# Patient Record
Sex: Male | Born: 1950
Health system: Southern US, Community
[De-identification: ages and names within clinical notes are randomized; demographics above are authoritative.]

## PROBLEM LIST (undated history)

## (undated) DIAGNOSIS — I10 Essential (primary) hypertension: Secondary | ICD-10-CM

## (undated) DIAGNOSIS — E785 Hyperlipidemia, unspecified: Secondary | ICD-10-CM

## (undated) HISTORY — DX: Hyperlipidemia, unspecified: E78.5

## (undated) HISTORY — PX: COLONOSCOPY W/ POLYPECTOMY: SHX1380

## (undated) HISTORY — PX: COLON SURGERY: SHX602

---

## 2001-05-17 ENCOUNTER — Emergency Department (HOSPITAL_COMMUNITY): Admission: EM | Admit: 2001-05-17 | Discharge: 2001-05-17 | Payer: Self-pay | Admitting: *Deleted

## 2002-01-05 ENCOUNTER — Other Ambulatory Visit: Admission: RE | Admit: 2002-01-05 | Discharge: 2002-01-05 | Payer: Self-pay | Admitting: Dermatology

## 2005-04-02 HISTORY — PX: KNEE SURGERY: SHX244

## 2006-11-28 ENCOUNTER — Ambulatory Visit (HOSPITAL_COMMUNITY): Admission: RE | Admit: 2006-11-28 | Discharge: 2006-11-28 | Payer: Self-pay | Admitting: Family Medicine

## 2006-12-17 ENCOUNTER — Ambulatory Visit (HOSPITAL_COMMUNITY): Admission: RE | Admit: 2006-12-17 | Discharge: 2006-12-17 | Payer: Self-pay | Admitting: General Surgery

## 2006-12-17 ENCOUNTER — Encounter (INDEPENDENT_AMBULATORY_CARE_PROVIDER_SITE_OTHER): Payer: Self-pay | Admitting: General Surgery

## 2008-07-07 ENCOUNTER — Emergency Department (HOSPITAL_COMMUNITY): Admission: EM | Admit: 2008-07-07 | Discharge: 2008-07-07 | Payer: Self-pay | Admitting: Emergency Medicine

## 2010-04-18 ENCOUNTER — Ambulatory Visit (HOSPITAL_COMMUNITY)
Admission: RE | Admit: 2010-04-18 | Discharge: 2010-04-18 | Payer: Self-pay | Source: Home / Self Care | Attending: General Surgery | Admitting: General Surgery

## 2010-04-19 ENCOUNTER — Ambulatory Visit (HOSPITAL_COMMUNITY)
Admission: RE | Admit: 2010-04-19 | Discharge: 2010-04-19 | Payer: Self-pay | Source: Home / Self Care | Attending: General Surgery | Admitting: General Surgery

## 2010-04-19 LAB — COMPREHENSIVE METABOLIC PANEL
ALT: 46 U/L (ref 0–53)
AST: 49 U/L — ABNORMAL HIGH (ref 0–37)
Albumin: 4.5 g/dL (ref 3.5–5.2)
Alkaline Phosphatase: 59 U/L (ref 39–117)
BUN: 11 mg/dL (ref 6–23)
CO2: 27 mEq/L (ref 19–32)
Calcium: 10 mg/dL (ref 8.4–10.5)
Chloride: 102 mEq/L (ref 96–112)
Creatinine, Ser: 0.94 mg/dL (ref 0.4–1.5)
GFR calc Af Amer: >60 mL/min (ref >60)
GFR calc non Af Amer: >60 mL/min (ref >60)
Glucose, Bld: 99 mg/dL (ref 70–99)
Potassium: 3.8 mEq/L (ref 3.5–5.1)
Sodium: 138 mEq/L (ref 135–145)
Total Bilirubin: 1.1 mg/dL (ref 0.3–1.2)
Total Protein: 7.1 g/dL (ref 6.0–8.3)

## 2010-04-19 LAB — CBC
HCT: 42.9 % (ref 39.0–52.0)
Hemoglobin: 15.3 g/dL (ref 13.0–17.0)
MCH: 34.7 pg — ABNORMAL HIGH (ref 26.0–34.0)
MCHC: 35.7 g/dL (ref 30.0–36.0)
MCV: 97.3 fL (ref 78.0–100.0)
Platelets: 231 10*3/uL (ref 150–400)
RBC: 4.41 MIL/uL (ref 4.22–5.81)
RDW: 13 % (ref 11.5–15.5)
WBC: 10.8 10*3/uL — ABNORMAL HIGH (ref 4.0–10.5)

## 2010-04-19 LAB — GLUCOSE, CAPILLARY: Glucose-Capillary: 98 mg/dL (ref 70–99)

## 2010-04-20 NOTE — Op Note (Signed)
NAME:  Erik Hernandez, Erik Hernandez           ACCOUNT NO.:  [id number]  MEDICAL RECORD NO.:  [id number]          PATIENT TYPE:  AMB  LOCATION:  DAY                           FACILITY:  [facility]  PHYSICIAN:  Dalia Heading, M.D.  DATE OF BIRTH:  March 27, 1951  DATE OF PROCEDURE:  04/18/2010 DATE OF DISCHARGE:                              OPERATIVE REPORT   PREOPERATIVE DIAGNOSIS:  Hematochezia.  POSTOPERATIVE DIAGNOSES: 1. Hematochezia. 2. Cecal neoplasm. 3. Proximal transverse colon polyp. 4. Polyps x2 in the sigmoid colon. 5. Diverticulosis of transverse, descending, and sigmoid colon     regions.  PROCEDURE:  Colonoscopy with snare polypectomies.  SURGEON:  Dalia Heading, MD  ANESTHESIA:  Demerol 50 mg IV, Versed 4 mg IV.  INDICATIONS:  The patient is a 60 year old white male who had several episodes of blood per rectum last week.  They have since stopped.  The patient now comes into the endoscopy suite for a colonoscopy.  He last had a colonoscopy in 2008, at which time multiple polyps were removed and diverticulosis was seen.  There was no family history of colon carcinoma.  The risks and benefits of the procedure including bleeding and perforation were fully explained to the patient, gave informed consent.  PROCEDURE NOTE:  The patient was placed in the left lateral decubitus position after placement of monitored anesthesia care.  Demerol and Versed were used throughout the procedure for anesthesia.  A rectal examination was performed which was negative.  The colonoscope was advanced to the cecum without difficulty.  Confirmation of placement to the cecum was done using transabdominal palpation and landmarks.  A sessile polypoid lesion was noted on the ileocecal valve.  In addition, there was a protruding neoplasm at the appendiceal orifice.  Multiple biopsies of both these regions were taken, sent to Pathology.  I did not feel comfortable removing the appendiceal mass as it  appeared almost to be full thickness with inversion.  The lumen was found.  The ascending colon was within normal limits.  In the proximal transverse colon, the sessile polyp was seen.  This was removed using the snare and cautery and sent to Pathology for further examination.  The distal transverse colon and descending colon regions were notable for diverticular disease.  In the sigmoid colon, extensive diverticular disease was seen. At approximately 25 cm from the anus, 1 pedunculated polyp was seen as well as a sessile polyp adjacent to it.  Both were removed using the snare and retrieved.  They were sent to Pathology for further examination.  The rectal was within normal limits.  No abnormal lesions were noted.  Retroflexion of the colonoscope revealed a small internal hemorrhoid present.  The colonoscope was returned to the neutral position and all air was evacuated from the colon and rectum prior to removal of the colonoscope.  The patient tolerated the procedure well.  There was no evidence of perforation at the end of the procedure.  The patient was transferred back to Day Surgery in stable condition.  COMPLICATIONS:  None.  SPECIMEN: 1. Appendiceal and ileocecal valve neoplasm, biopsies pending. 2. Transverse colon polyp, pathology pending. 3. Polyps x2,  sigmoid colon, pathology pending.  PLAN:  Further management pending the pathology results.  He will need to undergo a right hemicolectomy given the mass that we could not be removed from the appendiceal orifice.     Dalia Heading, M.D.     MAJ/MEDQ  D:  04/18/2010  T:  04/18/2010  Job:  [id number]  cc:   Corrie Mckusick, M.D. Fax: [phone]  Electronically Signed by Franky Macho M.D. on 04/20/2010 12:14:14 PM

## 2010-04-22 ENCOUNTER — Encounter: Payer: Self-pay | Admitting: Unknown Physician Specialty

## 2010-06-16 ENCOUNTER — Other Ambulatory Visit: Payer: Self-pay | Admitting: General Surgery

## 2010-06-16 ENCOUNTER — Encounter (HOSPITAL_COMMUNITY): Payer: BC Managed Care – PPO

## 2010-06-16 LAB — BASIC METABOLIC PANEL
BUN: 19 mg/dL (ref 6–23)
CO2: 26 mEq/L (ref 19–32)
Calcium: 9.6 mg/dL (ref 8.4–10.5)
Chloride: 101 mEq/L (ref 96–112)
Creatinine, Ser: 1.05 mg/dL (ref 0.4–1.5)
GFR calc Af Amer: >60 mL/min (ref >60)
GFR calc non Af Amer: >60 mL/min (ref >60)
Glucose, Bld: 103 mg/dL — ABNORMAL HIGH (ref 70–99)
Potassium: 3.7 mEq/L (ref 3.5–5.1)
Sodium: 139 mEq/L (ref 135–145)

## 2010-06-16 LAB — CBC
HCT: 43.7 % (ref 39.0–52.0)
Hemoglobin: 15.2 g/dL (ref 13.0–17.0)
MCH: 32.5 pg (ref 26.0–34.0)
MCHC: 34.8 g/dL (ref 30.0–36.0)
MCV: 93.6 fL (ref 78.0–100.0)
Platelets: 211 10*3/uL (ref 150–400)
RBC: 4.67 MIL/uL (ref 4.22–5.81)
RDW: 13 % (ref 11.5–15.5)
WBC: 9.1 10*3/uL (ref 4.0–10.5)

## 2010-06-16 LAB — ABO/RH: ABO/RH(D): B NEG

## 2010-06-16 LAB — SURGICAL PCR SCREEN
MRSA, PCR: NEGATIVE
Staphylococcus aureus: NEGATIVE

## 2010-06-21 ENCOUNTER — Inpatient Hospital Stay (HOSPITAL_COMMUNITY)
Admission: RE | Admit: 2010-06-21 | Discharge: 2010-06-24 | DRG: 149 | Disposition: A | Discharge status: Home / Self Care | Payer: BC Managed Care – PPO | Source: Ambulatory Visit | Attending: General Surgery | Admitting: General Surgery

## 2010-06-21 ENCOUNTER — Other Ambulatory Visit: Payer: Self-pay | Admitting: General Surgery

## 2010-06-21 DIAGNOSIS — D126 Benign neoplasm of colon, unspecified: Principal | ICD-10-CM | POA: Diagnosis present

## 2010-06-21 DIAGNOSIS — Z01812 Encounter for preprocedural laboratory examination: Secondary | ICD-10-CM

## 2010-06-21 DIAGNOSIS — I10 Essential (primary) hypertension: Secondary | ICD-10-CM | POA: Diagnosis present

## 2010-06-21 DIAGNOSIS — Z01810 Encounter for preprocedural cardiovascular examination: Secondary | ICD-10-CM

## 2010-06-21 DIAGNOSIS — E7800 Pure hypercholesterolemia, unspecified: Secondary | ICD-10-CM | POA: Diagnosis present

## 2010-06-21 NOTE — H&P (Signed)
  NAME:  Erik Hernandez, Erik Hernandez           ACCOUNT NO.:  [id number]  MEDICAL RECORD NO.:  [id number]           PATIENT TYPE:  LOCATION:                                 FACILITY:  PHYSICIAN:  Dalia Heading, M.D.  DATE OF BIRTH:  1950-12-03  DATE OF ADMISSION: DATE OF DISCHARGE:  LH                             HISTORY & PHYSICAL   CHIEF COMPLAINT:  Cecal neoplasm.  HISTORY OF PRESENT ILLNESS:  The patient is a 60 year old white male who underwent a colonoscopy and was found to have multiple polyps.  But none where dysplastic or malignant, though the two cecal polyps could not be safely removed.  He now presents for a partial colectomy.  PAST MEDICAL HISTORY:  High cholesterol levels, hypertension, hypothyroidism, non-insulin-dependent diabetes mellitus.  PAST SURGICAL HISTORY:  As noted above.  CURRENT MEDICATIONS:  Synthroid 25 mcg p.o. daily, simvastatin 40 mg p.o. daily, Azor 5/20 mg p.o. daily, fenofibrate 160 mg p.o. daily, bisoprolol/hydrochlorothiazide 2.5/6.25 mg p.o. daily, baby aspirin which he is holding.  ALLERGIES:  Zocor.  REVIEW OF SYSTEMS:  The patient smokes a pack cigarettes a day.  Drinks alcohol socially.  He denies any other cardiopulmonary difficulties or bleeding disorders.  FAMILY MEDICAL HISTORY:  The patient denies any family medical history of colon cancer.  PHYSICAL EXAMINATION:  GENERAL:  The patient is a well-developed, well- nourished white male in no acute distress. LUNGS:  Clear to auscultation with equal breath sounds bilaterally. HEART:  Reveals regular rate and rhythm without S3, S4, or murmurs. ABDOMEN:  Soft, nontender, and nondistended.  No hepatosplenomegaly or masses are noted. RECTAL:  Negative.  IMPRESSION:  Cecal neoplasm.  PLAN:  The patient is scheduled for a laparoscopic hand-assisted partial colectomy on June 21, 2010.  The risks and benefits of the procedure including bleeding, infection, and the possibly of a blood  transfusion were fully explained to the patient.  I gave informed consent.     Dalia Heading, M.D.     MAJ/MEDQ  D:  06/13/2010  T:  06/14/2010  Job:  [id number]  cc:   Short Stay at [facility]  Corrie Mckusick, M.D. Fax: [phone]  Electronically Signed by Franky Macho M.D. on 06/21/2010 07:33:35 AM

## 2010-06-22 LAB — BASIC METABOLIC PANEL
BUN: 9 mg/dL (ref 6–23)
CO2: 26 mEq/L (ref 19–32)
Calcium: 8.8 mg/dL (ref 8.4–10.5)
Chloride: 107 mEq/L (ref 96–112)
Creatinine, Ser: 0.81 mg/dL (ref 0.4–1.5)
GFR calc Af Amer: >60 mL/min (ref >60)
GFR calc non Af Amer: >60 mL/min (ref >60)
Glucose, Bld: 110 mg/dL — ABNORMAL HIGH (ref 70–99)
Potassium: 3.4 mEq/L — ABNORMAL LOW (ref 3.5–5.1)
Sodium: 139 mEq/L (ref 135–145)

## 2010-06-22 LAB — CBC
HCT: 39.6 % (ref 39.0–52.0)
Hemoglobin: 13.2 g/dL (ref 13.0–17.0)
MCH: 32.6 pg (ref 26.0–34.0)
MCHC: 33.3 g/dL (ref 30.0–36.0)
MCV: 97.8 fL (ref 78.0–100.0)
Platelets: 182 10*3/uL (ref 150–400)
RBC: 4.05 MIL/uL — ABNORMAL LOW (ref 4.22–5.81)
RDW: 13.1 % (ref 11.5–15.5)
WBC: 10.1 10*3/uL (ref 4.0–10.5)

## 2010-06-22 LAB — DIFFERENTIAL
Basophils Absolute: 0 10*3/uL (ref 0.0–0.1)
Basophils Relative: 0 % (ref 0–1)
Eosinophils Absolute: 0 10*3/uL (ref 0.0–0.7)
Eosinophils Relative: 0 % (ref 0–5)
Lymphocytes Relative: 16 % (ref 12–46)
Lymphs Abs: 1.6 10*3/uL (ref 0.7–4.0)
Monocytes Absolute: 0.7 10*3/uL (ref 0.1–1.0)
Monocytes Relative: 7 % (ref 3–12)
Neutro Abs: 7.8 10*3/uL — ABNORMAL HIGH (ref 1.7–7.7)
Neutrophils Relative %: 77 % (ref 43–77)

## 2010-06-22 LAB — PHOSPHORUS: Phosphorus: 3 mg/dL (ref 2.3–4.6)

## 2010-06-22 LAB — ALBUMIN: Albumin: 3.5 g/dL (ref 3.5–5.2)

## 2010-06-22 LAB — MAGNESIUM: Magnesium: 1.9 mg/dL (ref 1.5–2.5)

## 2010-06-22 NOTE — Op Note (Signed)
NAME:  Erik Hernandez, Erik Hernandez           ACCOUNT NO.:  [id number]  MEDICAL RECORD NO.:  [id number]           PATIENT TYPE:  I  LOCATION:  A308                          FACILITY:  [facility]  PHYSICIAN:  Dalia Heading, M.D.  DATE OF BIRTH:  10-Oct-1950  DATE OF PROCEDURE:  06/21/2010 DATE OF DISCHARGE:                              OPERATIVE REPORT   PREOPERATIVE DIAGNOSIS:  Neoplasm.  POSTOPERATIVE DIAGNOSIS:  Neoplasm.  PROCEDURE:  Laparoscopic hand-assisted partial colectomy.  SURGEON:  Dalia Heading, MD  ANESTHESIA:  General endotracheal.  INDICATIONS:  The patient is a 60 year old white male who underwent a screening colonoscopy and was found to have a mass at the base of the appendix.  Initial biopsies revealed adenomatous polyps.  The patient now comes to the operating room for a laparoscopic hand-assisted partial colectomy.  The risks and benefits of the procedure including bleeding, infection, pain, and the possibility of a blood transfusion were fully explained to the patient, gave informed consent.  PROCEDURE NOTE:  The patient was placed in the supine position.  After induction of general endotracheal anesthesia, the abdomen was prepped and draped using the usual sterile technique with DuraPrep.  Surgical site confirmation was performed.  A midline incision was made at the level of the umbilicus.  This was made about 8 cm.  Peritoneal cavity was entered without difficulty. GelPort was then inserted.  An additional 11-mm trocar was placed in the left upper quadrant and 11-mm trocar was placed in the suprapubic region.  The abdomen was then insufflated at 16 mmHg pressure.  The mass was palpable within the cecum at the base of the appendix.  The descending colon was mobilized along its peritoneal reflection.  Once the dissection had been performed, the terminal ileum and ascending colon were exteriorized through the GelPort.  A GIA stapler was placed across the terminal  ileum as well as the distal ascending colon and fired.  The mesentery was then divided using the EnSeal as well as 0 silk ties.  The specimen was then sent to Pathology for further examination.  A side-to-side ileocolic anastomosis was then performed using a GIA-70 stapler.  The enterotomy was closed using a TA-60 stapler.  The staple line was bolstered using 3-0 silk sutures.  A small piece of omentum was placed over the anastomosis for protection.  The mesenteric defect was closed using a 2-0 chromic gut running suture. The bowel was then returned into the abdominal cavity in orderly fashion.  All operating room personnel then changed their gloves.  The abdomen was copiously irrigated with normal saline.  Gelfoam was then placed in the right paracolic gutter.  All fluid and air were then evacuated from the abdominal cavity prior to removal of the trocars of the GelPort.  The midline fascia was closed using an 0 PDS running suture. Subcutaneous layer was reapproximated using 2-0 Vicryl interrupted sutures.  All skin incisions were closed using staples.  A 0.5% Sensorcaine was instilled at all the incisions.  Betadine ointment and dry sterile dressings were applied.  All tape and needle counts were correct at the end of the procedure. The patient was  extubated in the operating room and went back to recovery room, awake in stable condition.  COMPLICATIONS:  None.  SPECIMEN:  Ascending colon and terminal ileum.  ESTIMATED BLOOD LOSS:  20-50 mL.     Dalia Heading, M.D.     MAJ/MEDQ  D:  06/21/2010  T:  06/21/2010  Job:  [id number]  cc:   Corrie Mckusick, M.D. Fax: [phone]  Electronically Signed by Franky Macho M.D. on 06/22/2010 12:33:11 PM

## 2010-06-23 LAB — DIFFERENTIAL
Basophils Absolute: 0 10*3/uL (ref 0.0–0.1)
Basophils Relative: 0 % (ref 0–1)
Eosinophils Absolute: 0.1 10*3/uL (ref 0.0–0.7)
Eosinophils Relative: 1 % (ref 0–5)
Lymphocytes Relative: 18 % (ref 12–46)
Lymphs Abs: 2 10*3/uL (ref 0.7–4.0)
Monocytes Absolute: 1 10*3/uL (ref 0.1–1.0)
Monocytes Relative: 9 % (ref 3–12)
Neutro Abs: 8.1 10*3/uL — ABNORMAL HIGH (ref 1.7–7.7)
Neutrophils Relative %: 72 % (ref 43–77)

## 2010-06-23 LAB — CBC
HCT: 39.1 % (ref 39.0–52.0)
Hemoglobin: 13.1 g/dL (ref 13.0–17.0)
MCH: 32.9 pg (ref 26.0–34.0)
MCHC: 33.5 g/dL (ref 30.0–36.0)
MCV: 98.2 fL (ref 78.0–100.0)
Platelets: 177 10*3/uL (ref 150–400)
RBC: 3.98 MIL/uL — ABNORMAL LOW (ref 4.22–5.81)
RDW: 13.2 % (ref 11.5–15.5)
WBC: 11.2 10*3/uL — ABNORMAL HIGH (ref 4.0–10.5)

## 2010-06-23 LAB — BASIC METABOLIC PANEL
BUN: 7 mg/dL (ref 6–23)
CO2: 26 mEq/L (ref 19–32)
Calcium: 9.4 mg/dL (ref 8.4–10.5)
Chloride: 102 mEq/L (ref 96–112)
Creatinine, Ser: 0.89 mg/dL (ref 0.4–1.5)
GFR calc Af Amer: >60 mL/min (ref >60)
GFR calc non Af Amer: >60 mL/min (ref >60)
Glucose, Bld: 104 mg/dL — ABNORMAL HIGH (ref 70–99)
Potassium: 3.5 mEq/L (ref 3.5–5.1)
Sodium: 137 mEq/L (ref 135–145)

## 2010-06-23 LAB — ALBUMIN: Albumin: 3.6 g/dL (ref 3.5–5.2)

## 2010-06-23 LAB — MAGNESIUM: Magnesium: 2 mg/dL (ref 1.5–2.5)

## 2010-06-23 LAB — PHOSPHORUS: Phosphorus: 3.1 mg/dL (ref 2.3–4.6)

## 2010-06-24 LAB — CROSSMATCH
ABO/RH(D): B NEG
Antibody Screen: NEGATIVE
Unit division: 0
Unit division: 0

## 2010-06-24 LAB — BASIC METABOLIC PANEL
BUN: 11 mg/dL (ref 6–23)
CO2: 24 mEq/L (ref 19–32)
Chloride: 102 mEq/L (ref 96–112)
Glucose, Bld: 114 mg/dL — ABNORMAL HIGH (ref 70–99)
Potassium: 3.6 mEq/L (ref 3.5–5.1)

## 2010-06-24 LAB — DIFFERENTIAL
Basophils Relative: 0 % (ref 0–1)
Eosinophils Absolute: 0.2 10*3/uL (ref 0.0–0.7)
Eosinophils Relative: 2 % (ref 0–5)
Neutrophils Relative %: 72 % (ref 43–77)

## 2010-06-24 LAB — CBC
MCH: 32.3 pg (ref 26.0–34.0)
Platelets: 209 10*3/uL (ref 150–400)
RBC: 3.9 MIL/uL — ABNORMAL LOW (ref 4.22–5.81)
RDW: 13.1 % (ref 11.5–15.5)
WBC: 10.9 10*3/uL — ABNORMAL HIGH (ref 4.0–10.5)

## 2010-06-24 LAB — MAGNESIUM: Magnesium: 2.1 mg/dL (ref 1.5–2.5)

## 2010-06-26 NOTE — Discharge Summary (Signed)
  NAME:  Erik Hernandez, Erik Hernandez           ACCOUNT NO.:  [id number]  MEDICAL RECORD NO.:  [id number]           PATIENT TYPE:  I  LOCATION:  A308                          FACILITY:  [facility]  PHYSICIAN:  Dalia Heading, M.D.  DATE OF BIRTH:  12/25/1950  DATE OF ADMISSION:  06/21/2010 DATE OF DISCHARGE:  03/24/2012LH                              DISCHARGE SUMMARY   HOSPITAL COURSE SUMMARY:  The patient is a 60 year old white male who was found on colonoscopy to have two cecal polyps that could not be safely removed.  He underwent a laparoscopic hand-assisted partial colectomy on June 21, 2010.  He tolerated the procedure well.  His postoperative course has been for the most part unremarkable.  He had some hematochezia which was expected after this type of surgery and this has since improved significantly.  His hemoglobin at the time of discharge was 12.6.  His diet was advanced without difficulty.  Final pathology revealed benign polypoid lesions without evidence of dysplasia or malignancy.  The patient has been a total of these results.  The patient is being discharged home on postoperative day #3 in good improving condition.  DISCHARGE INSTRUCTIONS:  The patient is to follow up with Dr. Franky Macho on June 29, 2010.  Discharge medications include: 1. Percocet 1-2 tablets p.o. q.4 h. p.r.n. pain. 2. Aleve 2 tablets p.o. at bedtime p.r.n. 3. Baby aspirin 1 tablet p.o. daily. 4. Azor 5/40 mg p.o. daily, 5. Benadryl 25 mg p.o. at bedtime. 6. Bisoprolol/hydrochlorothiazide 2.5/6.25 mg p.o. daily. 7. Fenofibrate 160 mg p.o. daily. 8. Simvastatin 40 mg p.o. at bedtime. 9. Synthroid 25 mcg p.o. daily. 10.Multivitamin 1 tablet p.o. daily. 11.Krill oil 1 g p.o. daily  PRINCIPAL DIAGNOSES: 1. Colon polyps. 2. Hypertension. 3. High cholesterol levels.  PRINCIPAL PROCEDURE:  Laparoscopic hand-assisted partial colectomy on June 21, 2010.     Dalia Heading, M.D.     MAJ/MEDQ   D:  06/24/2010  T:  06/24/2010  Job:  [id number]  cc:   Corrie Mckusick, M.D. Fax: [phone]  Electronically Signed by Franky Macho M.D. on 06/26/2010 10:37:21 AM

## 2010-08-15 NOTE — H&P (Signed)
NAME:  Erik Hernandez, Erik Hernandez           ACCOUNT NO.:  [id number]   MEDICAL RECORD NO.:  [id number]          PATIENT TYPE:  AMB   LOCATION:  DAY                           FACILITY:  [facility]   PHYSICIAN:  Dalia Heading, M.D.  DATE OF BIRTH:  1951-01-11   DATE OF ADMISSION:  11/28/2006  DATE OF DISCHARGE:  LH                              HISTORY & PHYSICAL   CHIEF COMPLAINT:  Need  for screening colonoscopy.   HISTORY OF PRESENT ILLNESS:  The patient is a 60 year old white male who  was referred for endoscopic evaluation.  Needs a colonoscopy for  screening purposes.  No abdominal pain, weight loss, nausea, vomiting,  diarrhea, constipation, melena, or hematochezia have been noted.  He has  never had a colonoscopy.  There is no family history of colon carcinoma.   PAST MEDICAL HISTORY:  Hypertension.   PAST SURGICAL HISTORY:  Unremarkable.   CURRENT MEDICATIONS:  Avalide.   ALLERGIES:  No known drug allergies.   REVIEW OF SYSTEMS:  The patient smokes a pack of cigarettes a day.  He  drinks alcohol daily.  He denies any other cardiopulmonary difficulties  or bleeding disorders.   PHYSICAL EXAMINATION:  The patient is a well-developed, well-nourished  white male in no acute distress.  LUNGS:  Clear to auscultation with equal breath sounds bilaterally.  HEART EXAMINATION:  A regular rate and rhythm without S3, S4, or  murmurs.  ABDOMEN:  Soft, nontender, nondistended.  No hepatosplenomegaly or  masses are noted.  RECTAL EXAMINATION:  Deferred to the procedure.   IMPRESSION:  Need for screening colonoscopy.   PLAN:  The patient is scheduled for a colonoscopy on December 17, 2006.  The risks and benefits of the procedure, including bleeding and  perforation, were fully explained to the patient, who gave informed  consent.      Dalia Heading, M.D.  Electronically Signed     MAJ/MEDQ  D:  11/28/2006  T:  11/29/2006  Job:  [id number]   cc:   Patrica Duel, M.D.  Fax: [phone]

## 2011-05-01 ENCOUNTER — Encounter (HOSPITAL_COMMUNITY): Payer: Self-pay | Admitting: *Deleted

## 2011-05-01 ENCOUNTER — Emergency Department (HOSPITAL_COMMUNITY)
Admission: EM | Admit: 2011-05-01 | Discharge: 2011-05-01 | Disposition: A | Discharge status: Home / Self Care | Payer: BC Managed Care – PPO | Attending: Emergency Medicine | Admitting: Emergency Medicine

## 2011-05-01 DIAGNOSIS — S01319A Laceration without foreign body of unspecified ear, initial encounter: Secondary | ICD-10-CM

## 2011-05-01 DIAGNOSIS — F17200 Nicotine dependence, unspecified, uncomplicated: Secondary | ICD-10-CM | POA: Insufficient documentation

## 2011-05-01 DIAGNOSIS — S01309A Unspecified open wound of unspecified ear, initial encounter: Secondary | ICD-10-CM | POA: Insufficient documentation

## 2011-05-01 DIAGNOSIS — I10 Essential (primary) hypertension: Secondary | ICD-10-CM | POA: Insufficient documentation

## 2011-05-01 DIAGNOSIS — W19XXXA Unspecified fall, initial encounter: Secondary | ICD-10-CM | POA: Insufficient documentation

## 2011-05-01 HISTORY — DX: Essential (primary) hypertension: I10

## 2011-05-01 MED ORDER — OXYCODONE-ACETAMINOPHEN 5-325 MG PO TABS
2.0000 | ORAL_TABLET | Freq: Once | ORAL | Status: AC
  Administered 2011-05-01: 2 via ORAL
  Filled 2011-05-01: qty 2

## 2011-05-01 MED ORDER — ONDANSETRON HCL 4 MG PO TABS
4.0000 mg | ORAL_TABLET | Freq: Once | ORAL | Status: AC
  Administered 2011-05-01: 4 mg via ORAL
  Filled 2011-05-01: qty 1

## 2011-05-01 MED ORDER — LIDOCAINE HCL (PF) 2 % IJ SOLN
10.0000 mL | Freq: Once | INTRAMUSCULAR | Status: AC
  Administered 2011-05-01: 10 mL
  Filled 2011-05-01: qty 10

## 2011-05-01 MED ORDER — TETANUS-DIPHTH-ACELL PERTUSSIS 5-2.5-18.5 LF-MCG/0.5 IM SUSP
0.5000 mL | Freq: Once | INTRAMUSCULAR | Status: AC
  Administered 2011-05-01: 0.5 mL via INTRAMUSCULAR
  Filled 2011-05-01: qty 0.5

## 2011-05-01 MED ORDER — OXYCODONE-ACETAMINOPHEN 5-325 MG PO TABS: ORAL_TABLET | ORAL | Status: DC

## 2011-05-01 MED ORDER — CEPHALEXIN 500 MG PO CAPS
1000.0000 mg | ORAL_CAPSULE | Freq: Once | ORAL | Status: AC
  Administered 2011-05-01: 1000 mg via ORAL
  Filled 2011-05-01: qty 2

## 2011-05-01 MED ORDER — CEPHALEXIN 500 MG PO CAPS: 500.0000 mg | ORAL_CAPSULE | Freq: Four times a day (QID) | ORAL | Status: AC

## 2011-05-01 NOTE — ED Notes (Signed)
Patient is resting comfortably. 

## 2011-05-01 NOTE — ED Notes (Signed)
Patient being sutured 

## 2011-05-01 NOTE — ED Provider Notes (Signed)
History     CSN: [id number]  Arrival date & time 05/01/11  2046   First MD Initiated Contact with Patient 05/01/11 2059      Chief Complaint  Patient presents with  . Laceration    (Consider location/radiation/quality/duration/timing/severity/associated sxs/prior treatment) Patient is a 61 y.o. male presenting with skin laceration. The history is provided by the patient and the spouse.  Laceration  The incident occurred 1 to 2 hours ago. Pain location: rt ear. The laceration mechanism was a a metal edge. The pain is mild. The pain has been constant since onset. He reports no foreign bodies present. His tetanus status is out of date.    Past Medical History  Diagnosis Date  . Hypertension     Past Surgical History  Procedure Date  . Colonoscopy w/ polypectomy     No family history on file.  History  Substance Use Topics  . Smoking status: Current Everyday Smoker  . Smokeless tobacco: Not on file  . Alcohol Use: Yes      Review of Systems  Constitutional: Negative for activity change.       All ROS Neg except as noted in HPI  HENT: Negative for nosebleeds and neck pain.   Eyes: Negative for photophobia and discharge.  Respiratory: Negative for cough, shortness of breath and wheezing.   Cardiovascular: Negative for chest pain and palpitations.  Gastrointestinal: Negative for abdominal pain and blood in stool.  Genitourinary: Negative for dysuria, frequency and hematuria.  Musculoskeletal: Negative for back pain and arthralgias.  Skin: Negative.   Neurological: Negative for dizziness, seizures and speech difficulty.  Psychiatric/Behavioral: Negative for hallucinations and confusion.    Allergies  Review of patient's allergies indicates no known allergies.  Home Medications  No current outpatient prescriptions on file.  BP 137/73  Pulse 83  Temp(Src) 98.3 F (36.8 C) (Oral)  Resp 20  Ht 5\' 11"  (1.803 m)  Wt 240 lb (108.863 kg)  BMI 33.47 kg/m2  SpO2  97%  Physical Exam  Nursing note and vitals reviewed. Constitutional: He is oriented to person, place, and time. He appears well-developed and well-nourished.  Non-toxic appearance.  HENT:  Head: Normocephalic.  Right Ear: Tympanic membrane and external ear normal.  Left Ear: Tympanic membrane and external ear normal.       Complex laceration of the lobe of the right ear. TM wnl  Eyes: EOM and lids are normal. Pupils are equal, round, and reactive to light.  Neck: Normal range of motion. Neck supple. Carotid bruit is not present.       No pain or palpable deformity of the neck. No carotid bruits.  Cardiovascular: Normal rate, regular rhythm, normal heart sounds, intact distal pulses and normal pulses.   Pulmonary/Chest: Breath sounds normal. No respiratory distress.  Abdominal: Soft. Bowel sounds are normal. There is no tenderness. There is no guarding.  Musculoskeletal: Normal range of motion.  Lymphadenopathy:       Head (right side): No submandibular adenopathy present.       Head (left side): No submandibular adenopathy present.    He has no cervical adenopathy.  Neurological: He is alert and oriented to person, place, and time. He has normal strength. No cranial nerve deficit or sensory deficit.  Skin: Skin is warm and dry.  Psychiatric: He has a normal mood and affect. His speech is normal.    ED Course  LACERATION REPAIR Performed by: Kathie Dike Authorized by: Kathie Dike Consent: Verbal consent obtained. Risks  and benefits: risks, benefits and alternatives were discussed Consent given by: patient Patient understanding: patient states understanding of the procedure being performed Patient identity confirmed: arm band Time out: Immediately prior to procedure a "time out" was called to verify the correct patient, procedure, equipment, support staff and site/side marked as required. Body area: head/neck Location details: right ear Laceration length: 2.8  cm Foreign bodies: no foreign bodies Tendon involvement: none Nerve involvement: none Vascular damage: no Anesthesia: local infiltration Local anesthetic: bupivacaine 0.25% without epinephrine Patient sedated: no Preparation: Patient was prepped and draped in the usual sterile fashion. Irrigation solution: saline Amount of cleaning: standard Debridement: none Degree of undermining: none Skin closure: 5-0 nylon Number of sutures: 13 Technique: simple Approximation: close Approximation difficulty: complex Dressing: 4x4 sterile gauze Comments: Supporting dressing applied by me to prevent ear deformity.   (including critical care time)  Labs Reviewed - No data to display No results found.   No diagnosis found.    MDM  I have reviewed nursing notes, vital signs, and all appropriate lab and imaging results for this patient.        Kathie Dike, [place] 05/02/11 2145

## 2011-05-01 NOTE — ED Notes (Signed)
Family at bedside. 

## 2011-05-01 NOTE — ED Notes (Signed)
Pt reports he tripped and fell hitting rt ear on a piece of metal, lac to rt ear noted, pt holding pressure at this time

## 2011-05-02 NOTE — ED Provider Notes (Signed)
Medical screening examination/treatment/procedure(s) were performed by non-physician practitioner and as supervising physician I was immediately available for consultation/collaboration.   Geoffery Lyons, MD 05/02/11 [phone]

## 2012-01-12 ENCOUNTER — Emergency Department (HOSPITAL_COMMUNITY)
Admission: EM | Admit: 2012-01-12 | Discharge: 2012-01-12 | Disposition: A | Discharge status: Home / Self Care | Payer: BC Managed Care – PPO | Attending: Emergency Medicine | Admitting: Emergency Medicine

## 2012-01-12 ENCOUNTER — Encounter (HOSPITAL_COMMUNITY): Payer: Self-pay | Admitting: Emergency Medicine

## 2012-01-12 DIAGNOSIS — R040 Epistaxis: Secondary | ICD-10-CM

## 2012-01-12 DIAGNOSIS — F17200 Nicotine dependence, unspecified, uncomplicated: Secondary | ICD-10-CM | POA: Insufficient documentation

## 2012-01-12 DIAGNOSIS — I10 Essential (primary) hypertension: Secondary | ICD-10-CM | POA: Insufficient documentation

## 2012-01-12 MED ORDER — AMOXICILLIN-POT CLAVULANATE 875-125 MG PO TABS: 1.0000 | ORAL_TABLET | Freq: Two times a day (BID) | ORAL | Status: DC

## 2012-01-12 MED ORDER — LIDOCAINE-EPINEPHRINE (PF) 1 %-1:200000 IJ SOLN
INTRAMUSCULAR | Status: AC
  Administered 2012-01-12: 1 mL
  Filled 2012-01-12: qty 10

## 2012-01-12 MED ORDER — OXYMETAZOLINE HCL 0.05 % NA SOLN
1.0000 | Freq: Once | NASAL | Status: AC
  Administered 2012-01-12: 1 via NASAL

## 2012-01-12 MED ORDER — OXYMETAZOLINE HCL 0.05 % NA SOLN
NASAL | Status: AC
  Filled 2012-01-12: qty 15

## 2012-01-12 NOTE — ED Provider Notes (Signed)
History  This chart was scribed for Shalay Carder B. Bernette Mayers, MD by Shari Heritage. The patient was seen in room APA12/APA12. Patient's care was started at 0823.     CSN: [id number]  Arrival date & time 01/12/12  7829   First MD Initiated Contact with Patient 01/12/12 [phone]      Chief Complaint  Patient presents with  . Epistaxis     The history is provided by the patient. No language interpreter was used.    Erik Hernandez is a 61 y.o. male who presents to the Emergency Department complaining of intermittent epistaxis onset yesterday. Patient also states that he wasn't "felling well" around the time the bleeding began. Patient says that it bled for 2 hours yesterday and stopped, then it started again this morning while sleeping. He states that the bleeding began on one side, but is now bilateral. Patient denies any cold symptoms. He states that he hasn't started running heat in his house. Patient has a history of HTN. He does not take any blood thinners. He is a current everyday smoker (1 pack/day).    Past Medical History  Diagnosis Date  . Hypertension     Past Surgical History  Procedure Date  . Colonoscopy w/ polypectomy     History reviewed. No pertinent family history.  History  Substance Use Topics  . Smoking status: Current Every Day Smoker -- 1.0 packs/day    Types: Cigarettes  . Smokeless tobacco: Not on file  . Alcohol Use: Yes     moderate amounts      Review of Systems A complete 10 system review of systems was obtained and all systems are negative except as noted in the HPI and PMH.   Allergies  Review of patient's allergies indicates no known allergies.  Home Medications   Current Outpatient Rx  Name Route Sig Dispense Refill  . AMLODIPINE-OLMESARTAN 5-40 MG PO TABS Oral Take 1 tablet by mouth daily.    Marland Kitchen BISOPROLOL-HYDROCHLOROTHIAZIDE 2.5-6.25 MG PO TABS Oral Take 1 tablet by mouth daily.    . FENOFIBRATE 160 MG PO TABS Oral Take 160 mg by mouth  daily.    Marland Kitchen KRILL OIL 1000 MG PO CAPS Oral Take 1 capsule by mouth daily.    Marland Kitchen LEVOTHYROXINE SODIUM 25 MCG PO TABS Oral Take 25 mcg by mouth daily.    . ADULT MULTIVITAMIN W/MINERALS CH Oral Take 1 tablet by mouth daily.    Marland Kitchen NAPROXEN SODIUM 220 MG PO TABS Oral Take 440 mg by mouth at bedtime.    . OXYCODONE-ACETAMINOPHEN 5-325 MG PO TABS  1 or 2 po q4h prn pain 20 tablet 0  . SIMVASTATIN 40 MG PO TABS Oral Take 40 mg by mouth every evening.      BP 167/105  Pulse 85  Temp 98.1 F (36.7 C) (Axillary)  Resp 20  SpO2 98%  Physical Exam  Nursing note and vitals reviewed. Constitutional: He is oriented to person, place, and time. He appears well-developed and well-nourished.  HENT:  Head: Normocephalic and atraumatic.       Active bleeding in the left nare.  Eyes: EOM are normal. Pupils are equal, round, and reactive to light.  Neck: Normal range of motion. Neck supple.  Cardiovascular: Normal rate, normal heart sounds and intact distal pulses.   Pulmonary/Chest: Effort normal and breath sounds normal.  Abdominal: Bowel sounds are normal. He exhibits no distension. There is no tenderness.  Musculoskeletal: Normal range of motion. He exhibits no edema and  no tenderness.  Neurological: He is alert and oriented to person, place, and time. He has normal strength. No cranial nerve deficit or sensory deficit.  Skin: Skin is warm and dry. No rash noted.  Psychiatric: He has a normal mood and affect.    ED Course  Procedures (including critical care time) DIAGNOSTIC STUDIES: Oxygen Saturation is 98% on room air, normal by my interpretation.    COORDINATION OF CARE: 8:33am- Patient informed of current plan for treatment and evaluation and agrees with plan at this time.      Labs Reviewed - No data to display  No results found.   No diagnosis found.    MDM  Pt with L anterior epistaxis, instilled Afrin and held pressure. Continued to have some oozing, appears to be moderate  sized area of friability on the L anterior nasal septum. Applied quick clot powder, will reassess.   9:59 AM Bleeding is persisting despite above efforts. 5.5cm rapid rhino placed in L nare. Pt tolerated well. Will d/c with ENT followup in 3 days for recheck. Abx for prophylaxis.    I personally performed the services described in the documentation, which were scribed in my presence. The recorded information has been reviewed and considered.     Donell Sliwinski B. Bernette Mayers, MD 01/12/12 [phone]

## 2012-01-12 NOTE — ED Notes (Signed)
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 Pt reports it bleed for 2 hours and stopped. Pt reports the it started again this am and woke him up. Pt reports that it hasn't stopped since. Pt denies any injury/trauma to face. NAD noted.

## 2012-01-12 NOTE — ED Notes (Signed)
Pt c/o right side nose bleed. edp notified. Pt told to blow his nose, afrin administered, pressure applied.

## 2012-01-12 NOTE — ED Notes (Signed)
Rhino rocket to left nare.  

## 2012-01-15 ENCOUNTER — Encounter (HOSPITAL_COMMUNITY): Payer: Self-pay | Admitting: *Deleted

## 2012-01-15 ENCOUNTER — Emergency Department (HOSPITAL_COMMUNITY)
Admission: EM | Admit: 2012-01-15 | Discharge: 2012-01-16 | Disposition: A | Discharge status: Other Acute Inpatient Hospital | Payer: BC Managed Care – PPO | Attending: Emergency Medicine | Admitting: Emergency Medicine

## 2012-01-15 DIAGNOSIS — F17200 Nicotine dependence, unspecified, uncomplicated: Secondary | ICD-10-CM | POA: Insufficient documentation

## 2012-01-15 DIAGNOSIS — R040 Epistaxis: Secondary | ICD-10-CM

## 2012-01-15 DIAGNOSIS — I10 Essential (primary) hypertension: Secondary | ICD-10-CM | POA: Insufficient documentation

## 2012-01-15 NOTE — ED Provider Notes (Addendum)
History   This chart was scribed for Erik Nielsen, MD by Gerlean Ren. This patient was seen in room APA04/APA04 and the patient's care was started at 22:58.   CSN: [id number]  Arrival date & time 01/15/12  2032   First MD Initiated Contact with Patient 01/15/12 2256      Chief Complaint  Patient presents with  . Epistaxis    (Consider location/radiation/quality/duration/timing/severity/associated sxs/prior treatment) The history is provided by the patient. No language interpreter was used.   Erik Hernandez is a 61 y.o. male who presents to the Emergency Department complaining of off-and-on epistaxis from left nare for past 4 days.  Left nare had nasal packing and cauterization 3 days ago here, and another cauterization yesterday by Dr. Suszanne Conners.  Pt denies h/o similar epistaxis and denies taking blood thinners.  Pt has h/o HTN.  Pt is a current everyday smoker and reports moderate alcohol use.    Past Medical History  Diagnosis Date  . Hypertension     Past Surgical History  Procedure Date  . Colonoscopy w/ polypectomy     No family history on file.  History  Substance Use Topics  . Smoking status: Current Every Day Smoker -- 1.0 packs/day    Types: Cigarettes  . Smokeless tobacco: Not on file  . Alcohol Use: Yes     moderate amounts      Review of Systems  HENT: Positive for nosebleeds.   All other systems reviewed and are negative.    Allergies  Review of patient's allergies indicates no known allergies.  Home Medications   Current Outpatient Rx  Name Route Sig Dispense Refill  . AMLODIPINE-OLMESARTAN 5-40 MG PO TABS Oral Take 1 tablet by mouth daily.    . AMOXICILLIN-POT CLAVULANATE 875-125 MG PO TABS Oral Take 1 tablet by mouth every 12 (twelve) hours. 14 tablet 0  . BISOPROLOL-HYDROCHLOROTHIAZIDE 2.5-6.25 MG PO TABS Oral Take 1 tablet by mouth daily.    . FENOFIBRATE 160 MG PO TABS Oral Take 160 mg by mouth daily.    Marland Kitchen KRILL OIL 1000 MG PO CAPS Oral  Take 1 capsule by mouth daily.    Marland Kitchen LEVOTHYROXINE SODIUM 25 MCG PO TABS Oral Take 25 mcg by mouth daily.    . ADULT MULTIVITAMIN W/MINERALS CH Oral Take 1 tablet by mouth daily.    Marland Kitchen NAPROXEN SODIUM 220 MG PO TABS Oral Take 440 mg by mouth at bedtime.    . OXYCODONE-ACETAMINOPHEN 5-325 MG PO TABS Oral Take 1-2 tablets by mouth every 6 (six) hours as needed. 1 or 2 po q4h prn pain    . OXYMETAZOLINE HCL 0.05 % NA SOLN Nasal Place 2 sprays into the nose as needed. To stop nose bleed    . SIMVASTATIN 40 MG PO TABS Oral Take 40 mg by mouth every evening.      BP 164/95  Pulse 86  Resp 18  Ht 5\' 11"  (1.803 m)  Wt 240 lb (108.863 kg)  BMI 33.47 kg/m2  SpO2 99%  Physical Exam  Nursing note and vitals reviewed. Constitutional: He is oriented to person, place, and time. He appears well-developed.  HENT:  Head: Normocephalic and atraumatic.       Left nare actively bleeding.    Eyes: Conjunctivae normal and EOM are normal.  Neck: Normal range of motion. No tracheal deviation present.  Cardiovascular: Normal rate, regular rhythm and normal heart sounds.   No murmur heard. Pulmonary/Chest: Effort normal and breath sounds normal. He has  no wheezes.  Abdominal: Soft. He exhibits no distension. There is no tenderness.  Musculoskeletal: Normal range of motion. He exhibits no edema.  Neurological: He is alert and oriented to person, place, and time.  Skin: Skin is warm.  Psychiatric: He has a normal mood and affect.    ED Course  EPISTAXIS MANAGEMENT Date/Time: 01/15/2012 11:01 AM Performed by: Erik Hernandez Authorized by: Erik Hernandez Consent: Verbal consent obtained. Risks and benefits: risks, benefits and alternatives were discussed Consent given by: patient Patient understanding: patient states understanding of the procedure being performed Patient consent: the patient's understanding of the procedure matches consent given Procedure consent: procedure consent matches procedure  scheduled Required items: required blood products, implants, devices, and special equipment available Patient identity confirmed: verbally with patient Time out: Immediately prior to procedure a "time out" was called to verify the correct patient, procedure, equipment, support staff and site/side marked as required. Preparation: Patient was prepped and draped in the usual sterile fashion. Treatment site: left anterior Post-procedure assessment: bleeding stopped Treatment complexity: complex Patient tolerance: Patient tolerated the procedure well with no immediate complications. Comments: L nare rapid rhino for recurrent nose bleed and active bleeding in the ED   (including critical care time) DIAGNOSTIC STUDIES: Oxygen Saturation is 99% on room air, normal by my interpretation.    COORDINATION OF CARE: 23:08- Patient informed of clinical course, understand medical decision-making process, and agree with plan.  Advised follow-up with Dr. Suszanne Conners.     12:27 AM recheck - bleeding stopped. PT feels comfortable for d/c home, no symptoms of anemia. No indication for admit or ENT consult in the Ed at this time. PT will call Dr Suszanne Conners in the am for close follow up and further recommendations for recurrent bleeding.  Epistaxis precautions verbalized as understood.   MDM  Epistaxis L nare - uncontrolled by pressure, clots blown and unable to visualize bleed site.  Decision made to place packing. RX Augmentin, bleeding controlled. VS and nursing notes reviewed.    I personally performed the services described in this documentation, which was scribed in my presence. The recorded information has been reviewed and considered.          Erik Nielsen, MD 01/16/12 0030   Nurse going over d/c instructions and PT began bleeding from both nares. Pressure increased in rapid rhino balloon and reassessed - still bleeding. Bilateral packing placed. ENT consult.   I spoke with Dr. Annalee Genta, doe snot take  call for Dr Avel Sensor patients.   1:54 AM d/w Dr Dorma Russell - he accepts PT in [place] to [facility] ED. Plan [facility] to transfer. IVfs, labs drawn and pending. VS WNL, pulse ox 99 RA with bilat packing in place  Erik Nielsen, MD 01/16/12 0200

## 2012-01-15 NOTE — ED Notes (Signed)
Patient states he saw ent yesterday for same and nose was cauterized

## 2012-01-15 NOTE — ED Notes (Signed)
Patient's nose still oozing blood from around the packing at this time, but patient states that it is no longer running down his throat.

## 2012-01-15 NOTE — ED Notes (Signed)
Pt has been seen in the er earlier this week & cauterized, packing applied & was removed by ent MD yesterday, started back bleeding tonight.

## 2012-01-16 LAB — CBC
HCT: 40.4 % (ref 39.0–52.0)
Hemoglobin: 13.8 g/dL (ref 13.0–17.0)
MCHC: 34.2 g/dL (ref 30.0–36.0)
RDW: 13.5 % (ref 11.5–15.5)
WBC: 11.7 10*3/uL — ABNORMAL HIGH (ref 4.0–10.5)

## 2012-01-16 LAB — BASIC METABOLIC PANEL
BUN: 26 mg/dL — ABNORMAL HIGH (ref 6–23)
Chloride: 100 mEq/L (ref 96–112)
GFR calc Af Amer: >90 mL/min (ref >90)
GFR calc non Af Amer: 88 mL/min — ABNORMAL LOW (ref >90)
Potassium: 3.9 mEq/L (ref 3.5–5.1)
Sodium: 137 mEq/L (ref 135–145)

## 2012-01-16 LAB — PROTIME-INR: INR: 1.1 (ref 0.00–1.49)

## 2012-01-16 LAB — APTT: aPTT: 37 seconds (ref 24–37)

## 2012-01-16 MED ORDER — COCAINE HCL 4 % EX SOLN
4.0000 mL | Freq: Once | CUTANEOUS | Status: DC
  Filled 2012-01-16: qty 4

## 2012-01-16 MED ORDER — OXYMETAZOLINE HCL 0.05 % NA SOLN: 1.0000 | Freq: Once | NASAL | Status: DC

## 2012-01-16 MED ORDER — AMOXICILLIN-POT CLAVULANATE 875-125 MG PO TABS: 1.0000 | ORAL_TABLET | Freq: Two times a day (BID) | ORAL | Status: DC

## 2012-01-16 NOTE — ED Provider Notes (Signed)
[facility] ED Note:  Called by Dr. Dierdre Highman of [facility] ED at 1:50 am.. Patient was in ED bleeding from the L side of his nose. He initially developed spontaneous, non-traumatic epistaxis on 01-11-12. The bleeding stopped spontaneously that evening. Patient hemorrhaged again on 01-12-12 and was seen in [facility] ED. RhinoRocket placed. Bleeding stopped. Rebled on 01-14-12 and was seen and L nose cauterized by Dr. Suszanne Conners. Bled again on 01-15-12 at 8pm. Seen again in [facility] ED by Dr. Dierdre Highman. Bilateral RhinoRocket packs placed without control. I was called as I was on call for the ED for Dr. Suszanne Conners. BP stable.  Patient seen in [facility] ED, B17. Bleeding from both nares. L pack had been sneezed out. R pack was in place but ineffective. Patient was awake and alert. Able to answer questions. Cooperative.  IV started. Patient given 1 L of NS bolus as he was bleeding briskly. [facility] lab reviewed. Hb 13.8, HCT 40.4. Coags ordered - PT= 14.1, PTT= 37, INR = 1.10  ED Procedure:  R packing placed at [facility] removed. Very large clot removed from nasal cavity and NP. No active bleeding sites seen on R. Bleeding was coming from L side.  L nasal cavity evacuated of clot. Narrow L nasal chamber. Previous surgicel packing placed by Dr. Suszanne Conners in place. Partially removed. Patient was bleeding briskly from junction of posterior and middle 1/3. Bilateral nasal anesthesia obtained with mixture of topical neosynephrine and 5 ml 4% cocaine. L greater palatine block placed with 1ml of 1% xylocaine with 1:100,000 epinephrine.  Bleeding found anterior to previous surgicel packing, coming from inferior aspect of L inferior turbinate. Bleeding controlled with topical AgNO3 and four small surgicel packs. Further clot removed from NP and R posterior nasal cavity.  Patient tolerated procedure well. Bleeding controlled. No complications.  IMP: 1. L mid nasal cavity bleeding controlled with topical AgNO3 and surgicel packing. 2.  Patient lost estimated 100 ml of blood 3. Lab profile stable. Pt does not have a coagulopathy.  PLAN: 1. Patient is too avoid nose blowing or sneezing without his mouth open for 2 wks. 2. HOB elevated 30 degrees for next wk while sleeping. 3. Avoid heavy lifting or straining for 2 wks. 4. Avoid aspirin, NSAIDS or aspirin products for 1 month. 5. Make follow-up appoint with Dr. Suszanne Conners to be checked in 2-3 days. 6. Return to ED for any further bleeding.

## 2012-01-16 NOTE — Discharge Summary (Signed)
  Admission DX: L mid epistaxis, recurrent Discharge DX: same Procedure: Cauterization and anterior nasal packing control of L recurrent epistaxis Complications: None Medications: continue augmentin as per Dr. Suszanne Conners, tylenol Q4hrs. Prn.  Return Visit: Contact Dr. Avel Sensor office for follow-up appointment in 2-3 days Discharge Status: stable.  Carolan Shiver, M.D.

## 2012-01-16 NOTE — ED Notes (Signed)
Pt continues to be free of bleeding

## 2012-01-16 NOTE — ED Notes (Signed)
Pt continues to be free of bleeding 

## 2012-01-16 NOTE — ED Notes (Signed)
Right side of nose packed at this time, bleeding controlled, physician at bedside

## 2012-01-16 NOTE — ED Notes (Signed)
Dr Dorma Russell done with proceudre.

## 2012-01-16 NOTE — ED Notes (Signed)
Pt arrived to [facility] ED with [facility].  

## 2012-01-16 NOTE — ED Notes (Signed)
Patient's nose still bleeding upon attempting to provide discharge papers. Physician notified, patient's nose repacked with longer packing. Will continue to monitor patient a while longer.

## 2012-01-16 NOTE — ED Notes (Signed)
Dr Dorma Russell at bedside.

## 2012-01-16 NOTE — ED Notes (Signed)
Dr Dorma Russell at bedside attempting to stop bleeding with assistance from myself.

## 2012-01-16 NOTE — ED Notes (Signed)
Attempted to discharge patient a second time, patient at sink, both sides of nose bleeding, patient attempting to blow right side of nose because he was stopped up per patient, advised patient that we don't want him to breath through his nose that he needs to breath through his mouth at this time. Physician aware, states to hold discharge that he was going to call ENT @ [facility] at this time. Pressure held on right side of the nose, with bleeding noted to have slowed at this time.

## 2012-01-16 NOTE — ED Notes (Signed)
Pt's IV out, pt diaphoretic, appears tape peeled off allowing IV to slide out, Dr Dorma Russell aware

## 2012-01-16 NOTE — ED Notes (Signed)
Dr Dorma Russell paged and orders received.

## 2012-01-16 NOTE — ED Notes (Signed)
Per Dr Dorma Russell pt to stay for 1 hr observation to make sure bleeding does not resume. If no bleeding in the hr pt to be discharged.

## 2012-01-16 NOTE — ED Notes (Signed)
Report called to [facility], awaiting their arrival at this time

## 2012-02-07 ENCOUNTER — Ambulatory Visit (INDEPENDENT_AMBULATORY_CARE_PROVIDER_SITE_OTHER): Payer: BC Managed Care – PPO | Admitting: Otolaryngology

## 2012-02-07 DIAGNOSIS — R040 Epistaxis: Secondary | ICD-10-CM

## 2012-05-28 ENCOUNTER — Telehealth (INDEPENDENT_AMBULATORY_CARE_PROVIDER_SITE_OTHER): Payer: Self-pay

## 2012-05-28 NOTE — Telephone Encounter (Signed)
LMOM for pt to call me b/c I need to r/s his appt from Dr Dwain Sarna for 2/28. There was a mistake on the schedule for this day b/c it got opened up even though it was supposed to be closed early for Dr Dwain Sarna to go to surgery. I need to r/s this appt.

## 2012-05-30 ENCOUNTER — Ambulatory Visit (INDEPENDENT_AMBULATORY_CARE_PROVIDER_SITE_OTHER): Payer: BC Managed Care – PPO | Admitting: General Surgery

## 2012-06-13 ENCOUNTER — Encounter (INDEPENDENT_AMBULATORY_CARE_PROVIDER_SITE_OTHER): Payer: Self-pay | Admitting: General Surgery

## 2012-06-13 ENCOUNTER — Ambulatory Visit (INDEPENDENT_AMBULATORY_CARE_PROVIDER_SITE_OTHER): Payer: BC Managed Care – PPO | Admitting: General Surgery

## 2012-06-13 VITALS — BP 140/82 | HR 81 | Temp 97.8°F | Resp 18 | Ht 71.0 in | Wt 236.6 lb

## 2012-06-13 DIAGNOSIS — K4090 Unilateral inguinal hernia, without obstruction or gangrene, not specified as recurrent: Secondary | ICD-10-CM

## 2012-06-13 HISTORY — PX: HERNIA REPAIR: SHX51

## 2012-06-13 NOTE — Progress Notes (Signed)
Patient ID: Erik Hernandez, male   DOB: 12/25/1950, 62 y.o.   MRN: [id number]  Chief Complaint  Patient presents with  . New Evaluation    eval ing hernia    HPI Erik Hernandez is a 62 y.o. male.  Referred by [name] HPI This is a 62 year old male who has a history of hypertension hypercholesterolemia who presents with an over a year history of a right groin bulge. This has recently gotten significantly larger it is beginning to cause him some discomfort. He has no nausea or vomiting. His bowel movements in his urinary stream has not really changed all either. He has recently quit smoking he has been coughing more. This is causes to be more sore. Lying down helps relieve any discomfort. He comes in today to discuss having this repaired. Past Medical History  Diagnosis Date  . Hypertension   . Hyperlipidemia     Past Surgical History  Procedure Laterality Date  . Colonoscopy w/ polypectomy    . Knee surgery  2007    Family History  Problem Relation Age of Onset  . Cancer Mother     Breast  . Hypertension Mother   . Cancer Sister     Breast    Social History History  Substance Use Topics  . Smoking status: Former Smoker -- 1.00 packs/day    Types: Cigarettes    Quit date: 05/31/2012  . Smokeless tobacco: Former Neurosurgeon  . Alcohol Use: Yes     Comment: moderate amounts    No Known Allergies  Current Outpatient Prescriptions  Medication Sig Dispense Refill  . amLODipine-olmesartan (AZOR) 5-40 MG per tablet Take 1 tablet by mouth daily.      . bisoprolol-hydrochlorothiazide (ZIAC) 2.5-6.25 MG per tablet Take 1 tablet by mouth daily.      . fenofibrate 160 MG tablet Take 160 mg by mouth daily.      Erik Hernandez KRILL OIL 1000 MG CAPS Take 1 capsule by mouth daily.      Erik Hernandez levothyroxine (SYNTHROID, LEVOTHROID) 25 MCG tablet Take 25 mcg by mouth daily.      . Multiple Vitamin (MULITIVITAMIN WITH MINERALS) TABS Take 1 tablet by mouth daily.      . simvastatin (ZOCOR) 40 MG tablet  Take 40 mg by mouth every evening.       No current facility-administered medications for this visit.    Review of Systems Review of Systems  Constitutional: Negative for fever, chills and unexpected weight change.  HENT: Negative for hearing loss, congestion, sore throat, trouble swallowing and voice change.   Eyes: Negative for visual disturbance.  Respiratory: Positive for cough. Negative for wheezing.   Cardiovascular: Negative for chest pain, palpitations and leg swelling.  Gastrointestinal: Negative for nausea, vomiting, abdominal pain, diarrhea, constipation, blood in stool, abdominal distention, anal bleeding and rectal pain.  Genitourinary: Negative for hematuria and difficulty urinating.  Musculoskeletal: Negative for arthralgias.  Skin: Negative for rash and wound.  Neurological: Negative for seizures, syncope, weakness and headaches.  Hematological: Negative for adenopathy. Does not bruise/bleed easily.  Psychiatric/Behavioral: Negative for confusion.    Blood pressure 140/82, pulse 81, temperature 97.8 F (36.6 C), temperature source Temporal, resp. rate 18, height 5\' 11"  (1.803 m), weight 236 lb 9.6 oz (107.321 kg).  Physical Exam Physical Exam  Vitals reviewed. Constitutional: He appears well-developed and well-nourished.  Cardiovascular: Normal rate, regular rhythm and normal heart sounds.   Pulmonary/Chest: Effort normal and breath sounds normal. He has no wheezes. He has  no rales.  Abdominal: Soft. Bowel sounds are normal. He exhibits no distension. There is no tenderness. A hernia is present. Hernia confirmed positive in the right inguinal area. Hernia confirmed negative in the left inguinal area.  Lymphadenopathy:    He has no cervical adenopathy.    Data Reviewed Prior notes from PCP  Assessment    RIH    Plan    RIH with mesh We discussed observation versus repair.  I recommended repair due to increasing symptoms and fairly large hernia. We  discussed open inguinal hernia repair. I described the procedure in detail.   Goals should be achieved with surgery. We discussed the usage of mesh and the rationale behind that. I showed him a piece of mesh today.We went over the pathophysiology of inguinal hernia. We discussed the risks including bleeding, infection, recurrence, postoperative pain and chronic groin pain, testicular injury, urinary retention, numbness in groin and around incision.          Erik Hernandez 06/13/2012, 10:25 AM

## 2012-07-01 HISTORY — PX: EYE SURGERY: SHX253

## 2012-07-21 ENCOUNTER — Encounter (INDEPENDENT_AMBULATORY_CARE_PROVIDER_SITE_OTHER): Payer: Self-pay | Admitting: General Surgery

## 2012-07-21 DIAGNOSIS — K4090 Unilateral inguinal hernia, without obstruction or gangrene, not specified as recurrent: Secondary | ICD-10-CM

## 2012-08-11 ENCOUNTER — Ambulatory Visit (INDEPENDENT_AMBULATORY_CARE_PROVIDER_SITE_OTHER): Payer: BC Managed Care – PPO | Admitting: General Surgery

## 2012-08-11 ENCOUNTER — Encounter (INDEPENDENT_AMBULATORY_CARE_PROVIDER_SITE_OTHER): Payer: Self-pay | Admitting: General Surgery

## 2012-08-11 VITALS — BP 138/82 | HR 62 | Temp 97.6°F | Ht 71.0 in | Wt 239.8 lb

## 2012-08-11 DIAGNOSIS — Z09 Encounter for follow-up examination after completed treatment for conditions other than malignant neoplasm: Secondary | ICD-10-CM

## 2012-08-11 NOTE — Progress Notes (Signed)
Subjective:     Patient ID: Erik Hernandez, male   DOB: September 13, 1950, 62 y.o.   MRN: [id number]  HPI This is a 62 year old male who underwent repair of a right angle hernia with mesh about 3 weeks ago. He is doing well today without any complaints at all.  Review of Systems     Objective:   Physical Exam Healing right groin incision without any infection, no edema, no hernia    Assessment:     Status post right inguinal hernia repair     Plan:     He to return back to his normal activities. I told him I will see him back as needed.

## 2015-06-09 ENCOUNTER — Ambulatory Visit (INDEPENDENT_AMBULATORY_CARE_PROVIDER_SITE_OTHER): Payer: BC Managed Care – PPO | Admitting: Otolaryngology

## 2015-06-09 DIAGNOSIS — R040 Epistaxis: Secondary | ICD-10-CM

## 2015-10-25 DIAGNOSIS — E039 Hypothyroidism, unspecified: Secondary | ICD-10-CM | POA: Diagnosis not present

## 2015-10-25 DIAGNOSIS — I10 Essential (primary) hypertension: Secondary | ICD-10-CM | POA: Diagnosis not present

## 2015-10-25 DIAGNOSIS — Z713 Dietary counseling and surveillance: Secondary | ICD-10-CM | POA: Diagnosis not present

## 2015-10-25 DIAGNOSIS — E669 Obesity, unspecified: Secondary | ICD-10-CM | POA: Diagnosis not present

## 2015-10-25 DIAGNOSIS — Z23 Encounter for immunization: Secondary | ICD-10-CM | POA: Diagnosis not present

## 2015-10-25 DIAGNOSIS — E6609 Other obesity due to excess calories: Secondary | ICD-10-CM | POA: Diagnosis not present

## 2015-10-25 DIAGNOSIS — E782 Mixed hyperlipidemia: Secondary | ICD-10-CM | POA: Diagnosis not present

## 2015-10-25 DIAGNOSIS — Z6832 Body mass index (BMI) 32.0-32.9, adult: Secondary | ICD-10-CM | POA: Diagnosis not present

## 2015-10-25 DIAGNOSIS — Z1389 Encounter for screening for other disorder: Secondary | ICD-10-CM | POA: Diagnosis not present

## 2016-01-06 DIAGNOSIS — Z6832 Body mass index (BMI) 32.0-32.9, adult: Secondary | ICD-10-CM | POA: Diagnosis not present

## 2016-01-06 DIAGNOSIS — E782 Mixed hyperlipidemia: Secondary | ICD-10-CM | POA: Diagnosis not present

## 2016-01-06 DIAGNOSIS — Z1389 Encounter for screening for other disorder: Secondary | ICD-10-CM | POA: Diagnosis not present

## 2016-01-06 DIAGNOSIS — E063 Autoimmune thyroiditis: Secondary | ICD-10-CM | POA: Diagnosis not present

## 2016-01-06 DIAGNOSIS — I10 Essential (primary) hypertension: Secondary | ICD-10-CM | POA: Diagnosis not present

## 2016-01-11 DIAGNOSIS — M542 Cervicalgia: Secondary | ICD-10-CM | POA: Diagnosis not present

## 2016-01-11 DIAGNOSIS — M9902 Segmental and somatic dysfunction of thoracic region: Secondary | ICD-10-CM | POA: Diagnosis not present

## 2016-01-11 DIAGNOSIS — M546 Pain in thoracic spine: Secondary | ICD-10-CM | POA: Diagnosis not present

## 2016-01-11 DIAGNOSIS — M9901 Segmental and somatic dysfunction of cervical region: Secondary | ICD-10-CM | POA: Diagnosis not present

## 2016-01-13 DIAGNOSIS — M546 Pain in thoracic spine: Secondary | ICD-10-CM | POA: Diagnosis not present

## 2016-01-13 DIAGNOSIS — M9901 Segmental and somatic dysfunction of cervical region: Secondary | ICD-10-CM | POA: Diagnosis not present

## 2016-01-13 DIAGNOSIS — M9902 Segmental and somatic dysfunction of thoracic region: Secondary | ICD-10-CM | POA: Diagnosis not present

## 2016-01-13 DIAGNOSIS — M542 Cervicalgia: Secondary | ICD-10-CM | POA: Diagnosis not present

## 2016-01-16 DIAGNOSIS — M9901 Segmental and somatic dysfunction of cervical region: Secondary | ICD-10-CM | POA: Diagnosis not present

## 2016-01-16 DIAGNOSIS — M546 Pain in thoracic spine: Secondary | ICD-10-CM | POA: Diagnosis not present

## 2016-01-16 DIAGNOSIS — M542 Cervicalgia: Secondary | ICD-10-CM | POA: Diagnosis not present

## 2016-01-16 DIAGNOSIS — M9902 Segmental and somatic dysfunction of thoracic region: Secondary | ICD-10-CM | POA: Diagnosis not present

## 2016-01-23 DIAGNOSIS — M545 Low back pain: Secondary | ICD-10-CM | POA: Diagnosis not present

## 2016-01-23 DIAGNOSIS — M9902 Segmental and somatic dysfunction of thoracic region: Secondary | ICD-10-CM | POA: Diagnosis not present

## 2016-01-23 DIAGNOSIS — M9901 Segmental and somatic dysfunction of cervical region: Secondary | ICD-10-CM | POA: Diagnosis not present

## 2016-01-23 DIAGNOSIS — M9903 Segmental and somatic dysfunction of lumbar region: Secondary | ICD-10-CM | POA: Diagnosis not present

## 2016-01-23 DIAGNOSIS — M542 Cervicalgia: Secondary | ICD-10-CM | POA: Diagnosis not present

## 2016-01-23 DIAGNOSIS — M546 Pain in thoracic spine: Secondary | ICD-10-CM | POA: Diagnosis not present

## 2016-01-26 DIAGNOSIS — M9901 Segmental and somatic dysfunction of cervical region: Secondary | ICD-10-CM | POA: Diagnosis not present

## 2016-01-26 DIAGNOSIS — M9902 Segmental and somatic dysfunction of thoracic region: Secondary | ICD-10-CM | POA: Diagnosis not present

## 2016-01-26 DIAGNOSIS — M545 Low back pain: Secondary | ICD-10-CM | POA: Diagnosis not present

## 2016-01-26 DIAGNOSIS — M546 Pain in thoracic spine: Secondary | ICD-10-CM | POA: Diagnosis not present

## 2016-01-26 DIAGNOSIS — M542 Cervicalgia: Secondary | ICD-10-CM | POA: Diagnosis not present

## 2016-01-26 DIAGNOSIS — M9903 Segmental and somatic dysfunction of lumbar region: Secondary | ICD-10-CM | POA: Diagnosis not present

## 2016-03-20 ENCOUNTER — Encounter: Payer: Self-pay | Admitting: Orthopedic Surgery

## 2016-03-20 ENCOUNTER — Ambulatory Visit (INDEPENDENT_AMBULATORY_CARE_PROVIDER_SITE_OTHER): Payer: Medicare Other

## 2016-03-20 ENCOUNTER — Ambulatory Visit (INDEPENDENT_AMBULATORY_CARE_PROVIDER_SITE_OTHER): Payer: Medicare Other | Admitting: Orthopedic Surgery

## 2016-03-20 VITALS — BP 142/87 | HR 69 | Wt 231.0 lb

## 2016-03-20 DIAGNOSIS — M79672 Pain in left foot: Secondary | ICD-10-CM | POA: Diagnosis not present

## 2016-03-20 DIAGNOSIS — M722 Plantar fascial fibromatosis: Secondary | ICD-10-CM

## 2016-03-20 NOTE — Progress Notes (Signed)
Patient ID: Erik Hernandez, male   DOB: [birth year], 65 y.o.   MRN: [id number]  Chief Complaint  Patient presents with  . Foot Pain    LEFT HEEL PAIN    HPI Erik Hernandez is a 65 y.o. male.  Presents for evaluation and treatment of plantar heel pain  Left heel Moderate  Restart and first step pain  No treatment  Hard to walk Now having calf pain   Review of Systems Review of Systems  Constitutional: Negative for fever.  Respiratory: Negative.   Cardiovascular: Negative.      Past Medical History:  Diagnosis Date  . Hyperlipidemia   . Hypertension     Past Surgical History:  Procedure Laterality Date  . COLONOSCOPY W/ POLYPECTOMY    . EYE SURGERY Bilateral 07/2012   cataract surgery  . HERNIA REPAIR Right 06/13/12   Right inguinal hernia repair  . KNEE SURGERY  2007    Social History Social History  Substance Use Topics  . Smoking status: Former Smoker    Packs/day: 1.00    Types: Cigarettes    Quit date: 05/31/2012  . Smokeless tobacco: Former Systems developer  . Alcohol use Yes     Comment: moderate amounts    No Known Allergies  Current Meds  Medication Sig  . amLODipine (NORVASC) 10 MG tablet Take 10 mg by mouth daily.  . bisoprolol (ZEBETA) 5 MG tablet Take 5 mg by mouth daily.  . fenofibrate 160 MG tablet Take 160 mg by mouth daily.  . hydrochlorothiazide (HYDRODIURIL) 25 MG tablet Take 25 mg by mouth daily.  Marland Kitchen levothyroxine (SYNTHROID, LEVOTHROID) 25 MCG tablet Take 25 mcg by mouth daily.  Marland Kitchen olmesartan (BENICAR) 20 MG tablet Take 20 mg by mouth daily.  . simvastatin (ZOCOR) 10 MG tablet Take 10 mg by mouth daily.      Physical Exam Physical Exam BP (!) 142/87   Pulse 69   Wt 231 lb (104.8 kg)   BMI 32.22 kg/m   Gen. appearance. The patient is well-developed and well-nourished, grooming and hygiene are normal. There are no gross congenital abnormalities  The patient is alert and oriented to person place and time  Mood and affect are  normal  Ambulation labored with foot supination   Examination reveals the following: On inspection we find tenderness on the plantar aspect of the foot   With the range of motion normal in the ankle   Stability tests were normal    Strength tests revealed grade 5 motor strength  Skin we find no rash ulceration or erythema  Sensation remains intact  Impression vascular system shows no peripheral edema  Data Reviewed xrays left foot   Assessment    Encounter Diagnoses  Name Primary?  . Pain of left heel   . Plantar fascia syndrome Yes       Plan    Procedure note Inject plantar fascia left  Timeout was completed to confirm the site of injection  The medications used were 40 mg of Depo-Medrol and 1% lidocaine 3 cc  Anesthesia was provided by ethyl chloride and the skin was prepped with alcohol.  After cleaning the skin with alcohol a 25-gauge needle was used to inject the plantar fascia, no complications were noted sterile bandage was applied         [employer] 03/20/2016, 3:09 PM

## 2016-03-20 NOTE — Patient Instructions (Addendum)
You have received an injection of steroids into the joint. 15% of patients will have increased pain within the 24 hours postinjection.   This is transient and will go away.   We recommend that you use ice packs on the injection site for 20 minutes every 2 hours and extra strength Tylenol 2 tablets every 8 as needed until the pain resolves.  If you continue to have pain after taking the Tylenol and using the ice please call the office for further instructions.   Please do these exercises   Plantar Fasciitis Rehab Ask your health care provider which exercises are safe for you. Do exercises exactly as told by your health care provider and adjust them as directed. It is normal to feel mild stretching, pulling, tightness, or discomfort as you do these exercises, but you should stop right away if you feel sudden pain or your pain gets worse. Do not begin these exercises until told by your health care provider. Stretching and range of motion exercises These exercises warm up your muscles and joints and improve the movement and flexibility of your foot. These exercises also help to relieve pain. Exercise A: Plantar fascia stretch 1. Sit with your left / right leg crossed over your opposite knee. 2. Hold your heel with one hand with that thumb near your arch. With your other hand, hold your toes and gently pull them back toward the top of your foot. You should feel a stretch on the bottom of your toes or your foot or both. 3. Hold this stretch for______3____ seconds. 4. Slowly release your toes and return to the starting position. Repeat _____10_____ times. Complete this exercise ____2______ times a day. Exercise B: Gastroc, standing 1. Stand with your hands against a wall. 2. Extend your left / right leg behind you, and bend your front knee slightly. 3. Keeping your heels on the floor and keeping your back knee straight, shift your weight toward the wall without arching your back. You should feel a  gentle stretch in your left / right calf. 4. Hold this position for _____3_____ seconds. Repeat ______10____ times. Complete this exercise _______2___ times a day. Exercise C: Soleus, standing 1. Stand with your hands against a wall. 2. Extend your left / right leg behind you, and bend your front knee slightly. 3. Keeping your heels on the floor, bend your back knee and slightly shift your weight over the back leg. You should feel a gentle stretch deep in your calf. 4. Hold this position for ______3____ seconds. Repeat ____10______ times. Complete this exercise _____2____ times a day. Exercise D: Gastrocsoleus, standing 1. Stand with the ball of your left / right foot on a step. The ball of your foot is on the walking surface, right under your toes. 2. Keep your other foot firmly on the same step. 3. Hold onto the wall or a railing for balance. 4. Slowly lift your other foot, allowing your body weight to press your heel down over the edge of the step. You should feel a stretch in your left / right calf. 5. Hold this position for ____3______ seconds. 6. Return both feet to the step. 7. Repeat this exercise with a slight bend in your left / right knee. Repeat _____10_____ times with your left / right knee straight and ______10___ times with your left / right knee bent. Complete this exercise ______2____ times a day. Balance exercise This exercise builds your balance and strength control of your arch to help take pressure off  your plantar fascia. Exercise E: Single leg stand 1. Without shoes, stand near a railing or in a doorway. You may hold onto the railing or door frame as needed. 2. Stand on your left / right foot. Keep your big toe down on the floor and try to keep your arch lifted. Do not let your foot roll inward. 3. Hold this position for ___3_______ seconds. 4. If this exercise is too easy, you can try it with your eyes closed or while standing on a pillow. Repeat _____15_____ times.  Complete this exercise _____2_____ times a day. This information is not intended to replace advice given to you by your health care provider. Make sure you discuss any questions you have with your health care provider. Document Released: 03/19/2005 Document Revised: 11/22/2015 Document Reviewed: 01/31/2015 Elsevier Interactive Patient Education  2017 [employer].

## 2016-11-06 DIAGNOSIS — L821 Other seborrheic keratosis: Secondary | ICD-10-CM | POA: Diagnosis not present

## 2016-11-30 DIAGNOSIS — E063 Autoimmune thyroiditis: Secondary | ICD-10-CM | POA: Diagnosis not present

## 2016-11-30 DIAGNOSIS — Z6833 Body mass index (BMI) 33.0-33.9, adult: Secondary | ICD-10-CM | POA: Diagnosis not present

## 2016-11-30 DIAGNOSIS — E782 Mixed hyperlipidemia: Secondary | ICD-10-CM | POA: Diagnosis not present

## 2016-11-30 DIAGNOSIS — Z1389 Encounter for screening for other disorder: Secondary | ICD-10-CM | POA: Diagnosis not present

## 2016-11-30 DIAGNOSIS — E669 Obesity, unspecified: Secondary | ICD-10-CM | POA: Diagnosis not present

## 2016-12-04 DIAGNOSIS — E748 Other specified disorders of carbohydrate metabolism: Secondary | ICD-10-CM | POA: Diagnosis not present

## 2016-12-06 DIAGNOSIS — J029 Acute pharyngitis, unspecified: Secondary | ICD-10-CM | POA: Diagnosis not present

## 2016-12-06 DIAGNOSIS — H60502 Unspecified acute noninfective otitis externa, left ear: Secondary | ICD-10-CM | POA: Diagnosis not present

## 2017-01-30 DIAGNOSIS — Z23 Encounter for immunization: Secondary | ICD-10-CM | POA: Diagnosis not present

## 2017-04-01 DIAGNOSIS — Z6834 Body mass index (BMI) 34.0-34.9, adult: Secondary | ICD-10-CM | POA: Diagnosis not present

## 2017-04-01 DIAGNOSIS — N400 Enlarged prostate without lower urinary tract symptoms: Secondary | ICD-10-CM | POA: Diagnosis not present

## 2017-04-01 DIAGNOSIS — E782 Mixed hyperlipidemia: Secondary | ICD-10-CM | POA: Diagnosis not present

## 2017-04-01 DIAGNOSIS — I10 Essential (primary) hypertension: Secondary | ICD-10-CM | POA: Diagnosis not present

## 2017-04-15 ENCOUNTER — Ambulatory Visit (INDEPENDENT_AMBULATORY_CARE_PROVIDER_SITE_OTHER): Payer: Medicare Other | Admitting: Otolaryngology

## 2017-04-15 DIAGNOSIS — R040 Epistaxis: Secondary | ICD-10-CM | POA: Diagnosis not present

## 2017-12-11 DIAGNOSIS — X32XXXA Exposure to sunlight, initial encounter: Secondary | ICD-10-CM | POA: Diagnosis not present

## 2017-12-11 DIAGNOSIS — L821 Other seborrheic keratosis: Secondary | ICD-10-CM | POA: Diagnosis not present

## 2017-12-11 DIAGNOSIS — L905 Scar conditions and fibrosis of skin: Secondary | ICD-10-CM | POA: Diagnosis not present

## 2017-12-11 DIAGNOSIS — L570 Actinic keratosis: Secondary | ICD-10-CM | POA: Diagnosis not present

## 2017-12-11 DIAGNOSIS — C4441 Basal cell carcinoma of skin of scalp and neck: Secondary | ICD-10-CM | POA: Diagnosis not present

## 2017-12-11 DIAGNOSIS — D225 Melanocytic nevi of trunk: Secondary | ICD-10-CM | POA: Diagnosis not present

## 2018-01-13 DIAGNOSIS — E6609 Other obesity due to excess calories: Secondary | ICD-10-CM | POA: Diagnosis not present

## 2018-01-13 DIAGNOSIS — Z0001 Encounter for general adult medical examination with abnormal findings: Secondary | ICD-10-CM | POA: Diagnosis not present

## 2018-01-13 DIAGNOSIS — E063 Autoimmune thyroiditis: Secondary | ICD-10-CM | POA: Diagnosis not present

## 2018-01-13 DIAGNOSIS — E782 Mixed hyperlipidemia: Secondary | ICD-10-CM | POA: Diagnosis not present

## 2018-01-14 DIAGNOSIS — Z08 Encounter for follow-up examination after completed treatment for malignant neoplasm: Secondary | ICD-10-CM | POA: Diagnosis not present

## 2018-01-14 DIAGNOSIS — Z85828 Personal history of other malignant neoplasm of skin: Secondary | ICD-10-CM | POA: Diagnosis not present

## 2018-01-15 DIAGNOSIS — E6609 Other obesity due to excess calories: Secondary | ICD-10-CM | POA: Diagnosis not present

## 2018-01-15 DIAGNOSIS — Z1389 Encounter for screening for other disorder: Secondary | ICD-10-CM | POA: Diagnosis not present

## 2018-01-15 DIAGNOSIS — Z0001 Encounter for general adult medical examination with abnormal findings: Secondary | ICD-10-CM | POA: Diagnosis not present

## 2018-01-15 DIAGNOSIS — Z6834 Body mass index (BMI) 34.0-34.9, adult: Secondary | ICD-10-CM | POA: Diagnosis not present

## 2018-08-22 DIAGNOSIS — Z0100 Encounter for examination of eyes and vision without abnormal findings: Secondary | ICD-10-CM | POA: Diagnosis not present

## 2018-10-14 DIAGNOSIS — R69 Illness, unspecified: Secondary | ICD-10-CM | POA: Diagnosis not present

## 2018-11-03 DIAGNOSIS — R69 Illness, unspecified: Secondary | ICD-10-CM | POA: Diagnosis not present

## 2019-01-29 DIAGNOSIS — Z0001 Encounter for general adult medical examination with abnormal findings: Secondary | ICD-10-CM | POA: Diagnosis not present

## 2019-01-29 DIAGNOSIS — Z23 Encounter for immunization: Secondary | ICD-10-CM | POA: Diagnosis not present

## 2019-01-29 DIAGNOSIS — N401 Enlarged prostate with lower urinary tract symptoms: Secondary | ICD-10-CM | POA: Diagnosis not present

## 2019-01-29 DIAGNOSIS — I10 Essential (primary) hypertension: Secondary | ICD-10-CM | POA: Diagnosis not present

## 2019-01-29 DIAGNOSIS — E785 Hyperlipidemia, unspecified: Secondary | ICD-10-CM | POA: Diagnosis not present

## 2019-01-29 DIAGNOSIS — E7849 Other hyperlipidemia: Secondary | ICD-10-CM | POA: Diagnosis not present

## 2019-01-29 DIAGNOSIS — R945 Abnormal results of liver function studies: Secondary | ICD-10-CM | POA: Diagnosis not present

## 2019-01-29 DIAGNOSIS — Z6837 Body mass index (BMI) 37.0-37.9, adult: Secondary | ICD-10-CM | POA: Diagnosis not present

## 2019-01-29 DIAGNOSIS — Z1389 Encounter for screening for other disorder: Secondary | ICD-10-CM | POA: Diagnosis not present

## 2019-01-29 DIAGNOSIS — E039 Hypothyroidism, unspecified: Secondary | ICD-10-CM | POA: Diagnosis not present

## 2019-01-29 DIAGNOSIS — E063 Autoimmune thyroiditis: Secondary | ICD-10-CM | POA: Diagnosis not present

## 2019-02-02 ENCOUNTER — Other Ambulatory Visit: Payer: Self-pay | Admitting: Internal Medicine

## 2019-02-02 ENCOUNTER — Other Ambulatory Visit (HOSPITAL_COMMUNITY): Payer: Self-pay | Admitting: Internal Medicine

## 2019-02-02 DIAGNOSIS — R945 Abnormal results of liver function studies: Secondary | ICD-10-CM

## 2019-02-04 ENCOUNTER — Telehealth: Payer: Self-pay | Admitting: *Deleted

## 2019-02-10 ENCOUNTER — Other Ambulatory Visit: Payer: Self-pay

## 2019-02-10 ENCOUNTER — Ambulatory Visit (HOSPITAL_COMMUNITY)
Admission: RE | Admit: 2019-02-10 | Discharge: 2019-02-10 | Disposition: A | Discharge status: Home / Self Care | Payer: Medicare HMO | Source: Ambulatory Visit | Attending: Internal Medicine | Admitting: Internal Medicine

## 2019-02-10 DIAGNOSIS — R945 Abnormal results of liver function studies: Secondary | ICD-10-CM | POA: Insufficient documentation

## 2019-02-10 DIAGNOSIS — K8020 Calculus of gallbladder without cholecystitis without obstruction: Secondary | ICD-10-CM | POA: Diagnosis not present

## 2019-02-10 DIAGNOSIS — K760 Fatty (change of) liver, not elsewhere classified: Secondary | ICD-10-CM | POA: Diagnosis not present

## 2019-02-18 ENCOUNTER — Other Ambulatory Visit (HOSPITAL_COMMUNITY): Payer: Self-pay | Admitting: Internal Medicine

## 2019-02-18 ENCOUNTER — Encounter (HOSPITAL_COMMUNITY): Payer: Self-pay

## 2019-02-18 ENCOUNTER — Ambulatory Visit (HOSPITAL_COMMUNITY)
Admission: RE | Admit: 2019-02-18 | Discharge: 2019-02-18 | Disposition: A | Discharge status: Home / Self Care | Payer: Medicare HMO | Source: Ambulatory Visit | Attending: Internal Medicine | Admitting: Internal Medicine

## 2019-02-18 ENCOUNTER — Other Ambulatory Visit: Payer: Self-pay

## 2019-02-18 DIAGNOSIS — R05 Cough: Secondary | ICD-10-CM

## 2019-02-18 DIAGNOSIS — R059 Cough, unspecified: Secondary | ICD-10-CM

## 2019-04-22 DIAGNOSIS — R69 Illness, unspecified: Secondary | ICD-10-CM | POA: Diagnosis not present

## 2019-05-05 ENCOUNTER — Other Ambulatory Visit: Payer: Self-pay

## 2019-05-05 ENCOUNTER — Ambulatory Visit
Admission: EM | Admit: 2019-05-05 | Discharge: 2019-05-05 | Disposition: A | Discharge status: Home / Self Care | Payer: Medicare HMO | Attending: Family Medicine | Admitting: Family Medicine

## 2019-05-05 DIAGNOSIS — G500 Trigeminal neuralgia: Secondary | ICD-10-CM | POA: Diagnosis not present

## 2019-05-05 MED ORDER — PREDNISONE 10 MG (21) PO TBPK: ORAL_TABLET | Freq: Every day | ORAL | 0 refills | Status: AC

## 2019-05-05 NOTE — ED Provider Notes (Signed)
[facility]    CSN: [id number] Arrival date & time: 05/05/19  1740      History   Chief Complaint No chief complaint on file.   HPI Erik Hernandez is a 69 y.o. male.   Reports L earache, sore throat only on L side, tenderness and sensitivity to the left side of his head. Reports that he had dental work done on the R side of his mouth, but that he is not having pain on that side. Reports that he has been taking Penicillin x 2 days from his dental procedure. Denies drainage, tenderness, pain, swelling to the R side. Denies fever, chills, n/v/d, rash, any other symptoms.  The history is provided by the patient.    Past Medical History:  Diagnosis Date  . Hyperlipidemia   . Hypertension     There are no problems to display for this patient.   Past Surgical History:  Procedure Laterality Date  . COLONOSCOPY W/ POLYPECTOMY    . EYE SURGERY Bilateral 07/2012   cataract surgery  . HERNIA REPAIR Right 06/13/12   Right inguinal hernia repair  . KNEE SURGERY  2007       Home Medications    Prior to Admission medications   Medication Sig Start Date End Date Taking? Authorizing Provider  amLODipine (NORVASC) 10 MG tablet Take 10 mg by mouth daily.    [provider]  bisoprolol (ZEBETA) 5 MG tablet Take 5 mg by mouth daily.    [provider]  fenofibrate 160 MG tablet Take 160 mg by mouth daily.    [provider]  hydrochlorothiazide (HYDRODIURIL) 25 MG tablet Take 25 mg by mouth daily.    [provider]  levothyroxine (SYNTHROID, LEVOTHROID) 25 MCG tablet Take 25 mcg by mouth daily.    [provider]  olmesartan (BENICAR) 20 MG tablet Take 20 mg by mouth daily.    [provider]  predniSONE (STERAPRED UNI-PAK 21 TAB) 10 MG (21) TBPK tablet Take by mouth daily for 6 days. Take 6 tablets on day 1, 5 tablets on day 2, 4 tablets on day 3, 3 tablets on day 4, 2 tablets on day 5, 1 tablet on day 6 05/05/19  05/11/19  Faustino Congress, NP  simvastatin (ZOCOR) 10 MG tablet Take 10 mg by mouth daily.    [provider]    Family History Family History  Problem Relation Age of Onset  . Cancer Mother        Breast  . Hypertension Mother   . Cancer Sister        Breast    Social History Social History   Tobacco Use  . Smoking status: Former Smoker    Packs/day: 1.00    Types: Cigarettes    Quit date: 05/31/2012    Years since quitting: 6.9  . Smokeless tobacco: Former Network engineer Use Topics  . Alcohol use: Yes    Comment: moderate amounts  . Drug use: No     Allergies   Patient has no known allergies.   Review of Systems Review of Systems  Constitutional: Negative for chills, diaphoresis, fatigue and fever.  HENT: Positive for ear pain. Negative for congestion, rhinorrhea, sinus pressure, sinus pain and sore throat.        L ear   Eyes: Negative for pain and visual disturbance.  Respiratory: Negative for cough, shortness of breath and wheezing.   Cardiovascular: Negative for chest pain and palpitations.  Gastrointestinal: Negative  for abdominal pain, diarrhea, nausea and vomiting.  Genitourinary: Negative for dysuria and hematuria.  Musculoskeletal: Negative for arthralgias and back pain.  Skin: Negative for color change and rash.  Neurological: Negative for seizures and syncope.  All other systems reviewed and are negative.    Physical Exam Triage Vital Signs ED Triage Vitals  Enc Vitals Group     BP 05/05/19 1800 (!) 160/83     Pulse Rate 05/05/19 1800 (!) 55     Resp 05/05/19 1800 20     Temp 05/05/19 1800 97.9 F (36.6 C)     Temp src --      SpO2 05/05/19 1800 95 %     Weight --      Height --      Head Circumference --      Peak Flow --      Pain Score 05/05/19 1756 0     Pain Loc --      Pain Edu? --      Excl. in [place]? --    No data found.  Updated Vital Signs BP (!) 160/83   Pulse (!) 55   Temp 97.9 F (36.6 C)   Resp 20    SpO2 95%   Visual Acuity Right Eye Distance:   Left Eye Distance:   Bilateral Distance:    Right Eye Near:   Left Eye Near:    Bilateral Near:     Physical Exam Vitals and nursing note reviewed.  Constitutional:      General: He is not in acute distress.    Appearance: He is well-developed.  HENT:     Head: Normocephalic and atraumatic.     Right Ear: Tympanic membrane normal.     Left Ear: Tympanic membrane normal.     Nose: Nose normal. No congestion or rhinorrhea.     Mouth/Throat:     Mouth: Mucous membranes are moist.     Pharynx: No oropharyngeal exudate or posterior oropharyngeal erythema.  Eyes:     Conjunctiva/sclera: Conjunctivae normal.  Cardiovascular:     Rate and Rhythm: Normal rate and regular rhythm.     Pulses: Normal pulses.     Heart sounds: Normal heart sounds. No murmur.  Pulmonary:     Effort: Pulmonary effort is normal. No respiratory distress.     Breath sounds: Normal breath sounds. No stridor. No wheezing or rhonchi.  Abdominal:     General: Abdomen is flat.     Palpations: Abdomen is soft.     Tenderness: There is no abdominal tenderness.  Musculoskeletal:        General: Normal range of motion.     Cervical back: Normal range of motion and neck supple.  Skin:    General: Skin is warm and dry.     Capillary Refill: Capillary refill takes less than 2 seconds.  Neurological:     General: No focal deficit present.     Mental Status: He is alert and oriented to person, place, and time.  Psychiatric:        Mood and Affect: Mood normal.        Behavior: Behavior normal.      UC Treatments / Results  Labs (all labs ordered are listed, but only abnormal results are displayed) Labs Reviewed - No data to display  EKG   Radiology No results found.  Procedures Procedures (including critical care time)  Medications Ordered in UC Medications - No data to display  Initial Impression /  Assessment and Plan / UC Course  I have reviewed  the triage vital signs and the nursing notes.  Pertinent labs & imaging results that were available during my care of the patient were reviewed by me and considered in my medical decision making (see chart for details).     Likely trigeminal neuralgia. Tenderness to left side of head. Pain only to L side of throat and L ear. Prescribed prednisone pack. Instructed on how to take this medication, and educated on how to take. Instructed on when to report to the ED. Follow up with primary care as needed.  Final Clinical Impressions(s) / UC Diagnoses   Final diagnoses:  Trigeminal neuralgia of left side of face     Discharge Instructions     You have an aggravated nerve on the left side of your head and face. This could have happened when you were getting your dental work done, or even slept wrong.   Take the prescribed prednisone as directed on the package.  Go to the Emergency Department for high fever, shortness of breath, other concerning symptoms.    ED Prescriptions    Medication Sig Dispense Auth. Provider   predniSONE (STERAPRED UNI-PAK 21 TAB) 10 MG (21) TBPK tablet Take by mouth daily for 6 days. Take 6 tablets on day 1, 5 tablets on day 2, 4 tablets on day 3, 3 tablets on day 4, 2 tablets on day 5, 1 tablet on day 6 21 tablet Faustino Congress, NP     I have reviewed the PDMP during this encounter.   Faustino Congress, NP 05/05/19 1930

## 2019-05-05 NOTE — Discharge Instructions (Addendum)
You have an aggravated nerve on the left side of your head and face. This could have happened when you were getting your dental work done, or even slept wrong.   Take the prescribed prednisone as directed on the package.  Go to the Emergency Department for high fever, shortness of breath, other concerning symptoms.

## 2019-05-05 NOTE — ED Triage Notes (Signed)
Pt presents with c/o left ear ache and sore throat that comes and goes. Pt had dental work on the right side 2 days ago and was given Penicillin and has taken it for past 2 days

## 2019-05-06 DIAGNOSIS — R69 Illness, unspecified: Secondary | ICD-10-CM | POA: Diagnosis not present

## 2019-06-15 DIAGNOSIS — D225 Melanocytic nevi of trunk: Secondary | ICD-10-CM | POA: Diagnosis not present

## 2019-06-15 DIAGNOSIS — Z08 Encounter for follow-up examination after completed treatment for malignant neoplasm: Secondary | ICD-10-CM | POA: Diagnosis not present

## 2019-06-15 DIAGNOSIS — C4441 Basal cell carcinoma of skin of scalp and neck: Secondary | ICD-10-CM | POA: Diagnosis not present

## 2019-06-15 DIAGNOSIS — Z85828 Personal history of other malignant neoplasm of skin: Secondary | ICD-10-CM | POA: Diagnosis not present

## 2019-07-16 DIAGNOSIS — R7309 Other abnormal glucose: Secondary | ICD-10-CM | POA: Diagnosis not present

## 2019-07-16 DIAGNOSIS — Z6839 Body mass index (BMI) 39.0-39.9, adult: Secondary | ICD-10-CM | POA: Diagnosis not present

## 2019-07-16 DIAGNOSIS — R600 Localized edema: Secondary | ICD-10-CM | POA: Diagnosis not present

## 2019-07-16 DIAGNOSIS — G4719 Other hypersomnia: Secondary | ICD-10-CM | POA: Diagnosis not present

## 2019-07-16 DIAGNOSIS — I10 Essential (primary) hypertension: Secondary | ICD-10-CM | POA: Diagnosis not present

## 2019-07-20 DIAGNOSIS — M272 Inflammatory conditions of jaws: Secondary | ICD-10-CM | POA: Diagnosis not present

## 2019-07-21 DIAGNOSIS — R69 Illness, unspecified: Secondary | ICD-10-CM | POA: Diagnosis not present

## 2019-07-21 DIAGNOSIS — I10 Essential (primary) hypertension: Secondary | ICD-10-CM | POA: Diagnosis not present

## 2019-07-21 DIAGNOSIS — Z6838 Body mass index (BMI) 38.0-38.9, adult: Secondary | ICD-10-CM | POA: Diagnosis not present

## 2019-07-21 DIAGNOSIS — E1165 Type 2 diabetes mellitus with hyperglycemia: Secondary | ICD-10-CM | POA: Diagnosis not present

## 2019-07-22 DIAGNOSIS — Z08 Encounter for follow-up examination after completed treatment for malignant neoplasm: Secondary | ICD-10-CM | POA: Diagnosis not present

## 2019-07-22 DIAGNOSIS — Z85828 Personal history of other malignant neoplasm of skin: Secondary | ICD-10-CM | POA: Diagnosis not present

## 2019-07-29 DIAGNOSIS — G4730 Sleep apnea, unspecified: Secondary | ICD-10-CM | POA: Diagnosis not present

## 2019-08-17 DIAGNOSIS — G4733 Obstructive sleep apnea (adult) (pediatric): Secondary | ICD-10-CM | POA: Diagnosis not present

## 2019-09-09 DIAGNOSIS — G4733 Obstructive sleep apnea (adult) (pediatric): Secondary | ICD-10-CM | POA: Diagnosis not present

## 2019-09-23 ENCOUNTER — Other Ambulatory Visit: Payer: Self-pay

## 2019-09-23 ENCOUNTER — Ambulatory Visit
Admission: EM | Admit: 2019-09-23 | Discharge: 2019-09-23 | Disposition: A | Discharge status: Home / Self Care | Payer: Medicare HMO | Attending: Emergency Medicine | Admitting: Emergency Medicine

## 2019-09-23 DIAGNOSIS — R509 Fever, unspecified: Secondary | ICD-10-CM | POA: Diagnosis not present

## 2019-09-23 LAB — POCT URINALYSIS DIP (MANUAL ENTRY)
Bilirubin, UA: NEGATIVE
Glucose, UA: NEGATIVE mg/dL
Ketones, POC UA: NEGATIVE mg/dL
Leukocytes, UA: NEGATIVE
Nitrite, UA: NEGATIVE
Spec Grav, UA: 1.025 (ref 1.010–1.025)
Urobilinogen, UA: 1 E.U./dL
pH, UA: 6.5 (ref 5.0–8.0)

## 2019-09-23 NOTE — ED Triage Notes (Signed)
Pt presents with c/o chills body aches and weakness that began yesterday

## 2019-09-25 LAB — URINE CULTURE: Culture: NO GROWTH

## 2019-10-07 DIAGNOSIS — R69 Illness, unspecified: Secondary | ICD-10-CM | POA: Diagnosis not present

## 2019-10-09 DIAGNOSIS — G4733 Obstructive sleep apnea (adult) (pediatric): Secondary | ICD-10-CM | POA: Diagnosis not present

## 2019-10-14 DIAGNOSIS — E119 Type 2 diabetes mellitus without complications: Secondary | ICD-10-CM | POA: Diagnosis not present

## 2019-10-14 DIAGNOSIS — Z6837 Body mass index (BMI) 37.0-37.9, adult: Secondary | ICD-10-CM | POA: Diagnosis not present

## 2019-10-14 DIAGNOSIS — I10 Essential (primary) hypertension: Secondary | ICD-10-CM | POA: Diagnosis not present

## 2019-11-09 DIAGNOSIS — G4733 Obstructive sleep apnea (adult) (pediatric): Secondary | ICD-10-CM | POA: Diagnosis not present

## 2019-12-08 DIAGNOSIS — D485 Neoplasm of uncertain behavior of skin: Secondary | ICD-10-CM | POA: Diagnosis not present

## 2019-12-08 DIAGNOSIS — D3617 Benign neoplasm of peripheral nerves and autonomic nervous system of trunk, unspecified: Secondary | ICD-10-CM | POA: Diagnosis not present

## 2019-12-10 DIAGNOSIS — G4733 Obstructive sleep apnea (adult) (pediatric): Secondary | ICD-10-CM | POA: Diagnosis not present

## 2019-12-16 DIAGNOSIS — I10 Essential (primary) hypertension: Secondary | ICD-10-CM | POA: Diagnosis not present

## 2019-12-16 DIAGNOSIS — Z6838 Body mass index (BMI) 38.0-38.9, adult: Secondary | ICD-10-CM | POA: Diagnosis not present

## 2020-01-09 DIAGNOSIS — G4733 Obstructive sleep apnea (adult) (pediatric): Secondary | ICD-10-CM | POA: Diagnosis not present

## 2020-01-12 DIAGNOSIS — G4733 Obstructive sleep apnea (adult) (pediatric): Secondary | ICD-10-CM | POA: Diagnosis not present

## 2020-01-25 ENCOUNTER — Ambulatory Visit
Admission: EM | Admit: 2020-01-25 | Discharge: 2020-01-25 | Disposition: A | Discharge status: Home / Self Care | Payer: Medicare HMO | Attending: Family Medicine | Admitting: Family Medicine

## 2020-01-25 ENCOUNTER — Other Ambulatory Visit: Payer: Self-pay

## 2020-01-25 DIAGNOSIS — H65192 Other acute nonsuppurative otitis media, left ear: Secondary | ICD-10-CM

## 2020-01-25 DIAGNOSIS — H6992 Unspecified Eustachian tube disorder, left ear: Secondary | ICD-10-CM

## 2020-01-25 MED ORDER — CEPHALEXIN 500 MG PO CAPS: 1000.0000 mg | ORAL_CAPSULE | Freq: Two times a day (BID) | ORAL | 0 refills | Status: AC

## 2020-01-25 MED ORDER — PREDNISONE 20 MG PO TABS: 20.0000 mg | ORAL_TABLET | Freq: Every day | ORAL | 0 refills | Status: AC

## 2020-01-25 NOTE — ED Triage Notes (Signed)
Pt presents with left side ear pain for 4 days

## 2020-01-25 NOTE — Discharge Instructions (Signed)
Start Keflex 2 tablets twice daily for 7 days for your inner ear infection.  For the congestion in your ear which I suspect is causing the pain start prednisone with breakfast 3 times daily.  If pain worsens or does not improve follow-up with primary care provider return for evaluation.

## 2020-01-25 NOTE — ED Provider Notes (Signed)
[facility]    CSN: [id number] Arrival date & time: 01/25/20  1151      History   Chief Complaint Chief Complaint  Patient presents with  . Otalgia    HPI Erik Hernandez is a 69 y.o. male.   HPI  Patient presents today with left ear pressure and pain.  Occasionally the pain is shooting into the left ear.  Symptoms have been present for over 10 days.  Patient saw his audiologist about 10 days ago and was advised to follow-up to be evaluated for possible inner ear infection.   Past Medical History:  Diagnosis Date  . Hyperlipidemia   . Hypertension     There are no problems to display for this patient.   Past Surgical History:  Procedure Laterality Date  . COLONOSCOPY W/ POLYPECTOMY    . EYE SURGERY Bilateral 07/2012   cataract surgery  . HERNIA REPAIR Right 06/13/12   Right inguinal hernia repair  . KNEE SURGERY  2007       Home Medications    Prior to Admission medications   Medication Sig Start Date End Date Taking? Authorizing Provider  amLODipine (NORVASC) 10 MG tablet Take 10 mg by mouth daily.    [provider]  bisoprolol (ZEBETA) 5 MG tablet Take 5 mg by mouth daily.    [provider]  fenofibrate 160 MG tablet Take 160 mg by mouth daily.    [provider]  hydrochlorothiazide (HYDRODIURIL) 25 MG tablet Take 25 mg by mouth daily.    [provider]  levothyroxine (SYNTHROID, LEVOTHROID) 25 MCG tablet Take 25 mcg by mouth daily.    [provider]  olmesartan (BENICAR) 20 MG tablet Take 20 mg by mouth daily.    [provider]  simvastatin (ZOCOR) 10 MG tablet Take 10 mg by mouth daily.    [provider]    Family History Family History  Problem Relation Age of Onset  . Cancer Mother        Breast  . Hypertension Mother   . Cancer Sister        Breast    Social History Social History   Tobacco Use  . Smoking status: Former Smoker    Packs/day: 1.00     Types: Cigarettes    Quit date: 05/31/2012    Years since quitting: 7.6  . Smokeless tobacco: Former Network engineer Use Topics  . Alcohol use: Yes    Comment: moderate amounts  . Drug use: No     Allergies   Patient has no known allergies. Review of Systems Review of Systems Pertinent negatives listed in HPI Physical Exam Triage Vital Signs ED Triage Vitals  Enc Vitals Group     BP 01/25/20 1226 (!) 151/87     Pulse Rate 01/25/20 1226 (!) 52     Resp 01/25/20 1226 18     Temp 01/25/20 1226 98.2 F (36.8 C)     Temp src --      SpO2 01/25/20 1226 95 %     Weight --      Height --      Head Circumference --      Peak Flow --      Pain Score 01/25/20 1221 2     Pain Loc --      Pain Edu? --      Excl. in [place]? --    No data found.  Updated Vital Signs BP (!) 151/87  Pulse (!) 52   Temp 98.2 F (36.8 C)   Resp 18   SpO2 95%   Visual Acuity Right Eye Distance:   Left Eye Distance:   Bilateral Distance:    Right Eye Near:   Left Eye Near:    Bilateral Near:     Physical Exam Constitutional:      Appearance: He is obese.  HENT:     Right Ear: Hearing, tympanic membrane, ear canal and external ear normal.     Left Ear: External ear normal. Swelling and tenderness present. A middle ear effusion is present. Tympanic membrane is erythematous.  Cardiovascular:     Rate and Rhythm: Normal rate and regular rhythm.  Neurological:     Mental Status: He is alert.     UC Treatments / Results  Labs (all labs ordered are listed, but only abnormal results are displayed) Labs Reviewed - No data to display  EKG   Radiology No results found.  Procedures Procedures (including critical care time)  Medications Ordered in UC Medications - No data to display  Initial Impression / Assessment and Plan / UC Course  I have reviewed the triage vital signs and the nursing notes.  Pertinent labs & imaging results that were available during my care of the patient were  reviewed by me and considered in my medical decision making (see chart for details).    Acute otitis media. Treating with Keflex TID x 7 days. Treating inner ear swelling with short course of prednisone. Follow-up with PCP if symptoms worsen or do not improve. Final Clinical Impressions(s) / UC Diagnoses   Final diagnoses:  Other non-recurrent acute nonsuppurative otitis media of left ear  Eustachian tube disorder, left     Discharge Instructions     Start Keflex 2 tablets twice daily for 7 days for your inner ear infection.  For the congestion in your ear which I suspect is causing the pain start prednisone with breakfast 3 x days.  If pain worsens or does not improve follow-up with primary care provider return for evaluation.   ED Prescriptions    Medication Sig Dispense Auth. Provider   cephALEXin (KEFLEX) 500 MG capsule Take 2 capsules (1,000 mg total) by mouth 2 (two) times daily for 7 days. 28 capsule Scot Jun, FNP   predniSONE (DELTASONE) 20 MG tablet Take 1 tablet (20 mg total) by mouth daily with breakfast for 3 days. 3 tablet Scot Jun, FNP     PDMP not reviewed this encounter.   Scot Jun, FNP 01/27/20 [phone]

## 2020-02-02 DIAGNOSIS — Z23 Encounter for immunization: Secondary | ICD-10-CM | POA: Diagnosis not present

## 2020-02-02 DIAGNOSIS — Z0000 Encounter for general adult medical examination without abnormal findings: Secondary | ICD-10-CM | POA: Diagnosis not present

## 2020-02-02 DIAGNOSIS — R7309 Other abnormal glucose: Secondary | ICD-10-CM | POA: Diagnosis not present

## 2020-02-02 DIAGNOSIS — Z1331 Encounter for screening for depression: Secondary | ICD-10-CM | POA: Diagnosis not present

## 2020-02-02 DIAGNOSIS — E7849 Other hyperlipidemia: Secondary | ICD-10-CM | POA: Diagnosis not present

## 2020-02-02 DIAGNOSIS — Z0001 Encounter for general adult medical examination with abnormal findings: Secondary | ICD-10-CM | POA: Diagnosis not present

## 2020-02-02 DIAGNOSIS — E119 Type 2 diabetes mellitus without complications: Secondary | ICD-10-CM | POA: Diagnosis not present

## 2020-02-02 DIAGNOSIS — E063 Autoimmune thyroiditis: Secondary | ICD-10-CM | POA: Diagnosis not present

## 2020-02-02 DIAGNOSIS — Z6837 Body mass index (BMI) 37.0-37.9, adult: Secondary | ICD-10-CM | POA: Diagnosis not present

## 2020-02-02 DIAGNOSIS — Z125 Encounter for screening for malignant neoplasm of prostate: Secondary | ICD-10-CM | POA: Diagnosis not present

## 2020-02-02 DIAGNOSIS — I10 Essential (primary) hypertension: Secondary | ICD-10-CM | POA: Diagnosis not present

## 2020-02-09 DIAGNOSIS — G4733 Obstructive sleep apnea (adult) (pediatric): Secondary | ICD-10-CM | POA: Diagnosis not present

## 2020-03-09 DIAGNOSIS — L918 Other hypertrophic disorders of the skin: Secondary | ICD-10-CM | POA: Diagnosis not present

## 2020-03-09 DIAGNOSIS — L720 Epidermal cyst: Secondary | ICD-10-CM | POA: Diagnosis not present

## 2020-03-09 DIAGNOSIS — Z85828 Personal history of other malignant neoplasm of skin: Secondary | ICD-10-CM | POA: Diagnosis not present

## 2020-03-09 DIAGNOSIS — Z08 Encounter for follow-up examination after completed treatment for malignant neoplasm: Secondary | ICD-10-CM | POA: Diagnosis not present

## 2020-03-10 DIAGNOSIS — G4733 Obstructive sleep apnea (adult) (pediatric): Secondary | ICD-10-CM | POA: Diagnosis not present

## 2020-04-10 DIAGNOSIS — G4733 Obstructive sleep apnea (adult) (pediatric): Secondary | ICD-10-CM | POA: Diagnosis not present

## 2020-04-20 DIAGNOSIS — G4733 Obstructive sleep apnea (adult) (pediatric): Secondary | ICD-10-CM | POA: Diagnosis not present

## 2020-04-21 ENCOUNTER — Other Ambulatory Visit (HOSPITAL_COMMUNITY): Payer: Self-pay | Admitting: Physician Assistant

## 2020-04-21 DIAGNOSIS — R053 Chronic cough: Secondary | ICD-10-CM

## 2020-04-21 DIAGNOSIS — I10 Essential (primary) hypertension: Secondary | ICD-10-CM | POA: Diagnosis not present

## 2020-04-21 DIAGNOSIS — Z6838 Body mass index (BMI) 38.0-38.9, adult: Secondary | ICD-10-CM | POA: Diagnosis not present

## 2020-04-21 DIAGNOSIS — Z1389 Encounter for screening for other disorder: Secondary | ICD-10-CM | POA: Diagnosis not present

## 2020-04-21 DIAGNOSIS — M2550 Pain in unspecified joint: Secondary | ICD-10-CM | POA: Diagnosis not present

## 2020-05-11 DIAGNOSIS — G4733 Obstructive sleep apnea (adult) (pediatric): Secondary | ICD-10-CM | POA: Diagnosis not present

## 2020-05-24 ENCOUNTER — Other Ambulatory Visit: Payer: Self-pay

## 2020-05-24 ENCOUNTER — Ambulatory Visit (HOSPITAL_COMMUNITY)
Admission: RE | Admit: 2020-05-24 | Discharge: 2020-05-24 | Disposition: A | Discharge status: Home / Self Care | Payer: Medicare HMO | Source: Ambulatory Visit | Attending: Physician Assistant | Admitting: Physician Assistant

## 2020-05-24 DIAGNOSIS — R059 Cough, unspecified: Secondary | ICD-10-CM | POA: Diagnosis not present

## 2020-05-24 DIAGNOSIS — R053 Chronic cough: Secondary | ICD-10-CM | POA: Diagnosis not present

## 2020-06-02 DIAGNOSIS — Z6839 Body mass index (BMI) 39.0-39.9, adult: Secondary | ICD-10-CM | POA: Diagnosis not present

## 2020-06-02 DIAGNOSIS — I10 Essential (primary) hypertension: Secondary | ICD-10-CM | POA: Diagnosis not present

## 2020-06-08 DIAGNOSIS — G4733 Obstructive sleep apnea (adult) (pediatric): Secondary | ICD-10-CM | POA: Diagnosis not present

## 2020-06-20 DIAGNOSIS — G4733 Obstructive sleep apnea (adult) (pediatric): Secondary | ICD-10-CM | POA: Diagnosis not present

## 2020-06-23 DIAGNOSIS — I10 Essential (primary) hypertension: Secondary | ICD-10-CM | POA: Diagnosis not present

## 2020-06-23 DIAGNOSIS — Z6837 Body mass index (BMI) 37.0-37.9, adult: Secondary | ICD-10-CM | POA: Diagnosis not present

## 2020-07-09 DIAGNOSIS — G4733 Obstructive sleep apnea (adult) (pediatric): Secondary | ICD-10-CM | POA: Diagnosis not present

## 2020-08-08 DIAGNOSIS — G4733 Obstructive sleep apnea (adult) (pediatric): Secondary | ICD-10-CM | POA: Diagnosis not present

## 2020-09-02 DIAGNOSIS — G4733 Obstructive sleep apnea (adult) (pediatric): Secondary | ICD-10-CM | POA: Diagnosis not present

## 2020-10-18 DIAGNOSIS — G4733 Obstructive sleep apnea (adult) (pediatric): Secondary | ICD-10-CM | POA: Diagnosis not present

## 2020-11-01 DIAGNOSIS — G4733 Obstructive sleep apnea (adult) (pediatric): Secondary | ICD-10-CM | POA: Diagnosis not present

## 2020-12-06 DIAGNOSIS — G4733 Obstructive sleep apnea (adult) (pediatric): Secondary | ICD-10-CM | POA: Diagnosis not present

## 2020-12-26 DIAGNOSIS — Z23 Encounter for immunization: Secondary | ICD-10-CM | POA: Diagnosis not present

## 2020-12-26 DIAGNOSIS — I10 Essential (primary) hypertension: Secondary | ICD-10-CM | POA: Diagnosis not present

## 2020-12-26 DIAGNOSIS — M2550 Pain in unspecified joint: Secondary | ICD-10-CM | POA: Diagnosis not present

## 2020-12-26 DIAGNOSIS — E119 Type 2 diabetes mellitus without complications: Secondary | ICD-10-CM | POA: Diagnosis not present

## 2021-03-01 DIAGNOSIS — L820 Inflamed seborrheic keratosis: Secondary | ICD-10-CM | POA: Diagnosis not present

## 2021-03-01 DIAGNOSIS — Z08 Encounter for follow-up examination after completed treatment for malignant neoplasm: Secondary | ICD-10-CM | POA: Diagnosis not present

## 2021-03-01 DIAGNOSIS — L821 Other seborrheic keratosis: Secondary | ICD-10-CM | POA: Diagnosis not present

## 2021-03-01 DIAGNOSIS — D225 Melanocytic nevi of trunk: Secondary | ICD-10-CM | POA: Diagnosis not present

## 2021-03-01 DIAGNOSIS — Z85828 Personal history of other malignant neoplasm of skin: Secondary | ICD-10-CM | POA: Diagnosis not present

## 2021-03-01 DIAGNOSIS — L814 Other melanin hyperpigmentation: Secondary | ICD-10-CM | POA: Diagnosis not present

## 2021-03-07 DIAGNOSIS — G4733 Obstructive sleep apnea (adult) (pediatric): Secondary | ICD-10-CM | POA: Diagnosis not present

## 2021-06-06 DIAGNOSIS — G4733 Obstructive sleep apnea (adult) (pediatric): Secondary | ICD-10-CM | POA: Diagnosis not present

## 2021-06-14 DIAGNOSIS — M9902 Segmental and somatic dysfunction of thoracic region: Secondary | ICD-10-CM | POA: Diagnosis not present

## 2021-06-14 DIAGNOSIS — M9903 Segmental and somatic dysfunction of lumbar region: Secondary | ICD-10-CM | POA: Diagnosis not present

## 2021-06-14 DIAGNOSIS — M546 Pain in thoracic spine: Secondary | ICD-10-CM | POA: Diagnosis not present

## 2021-06-14 DIAGNOSIS — M62830 Muscle spasm of back: Secondary | ICD-10-CM | POA: Diagnosis not present

## 2021-06-14 DIAGNOSIS — M9905 Segmental and somatic dysfunction of pelvic region: Secondary | ICD-10-CM | POA: Diagnosis not present

## 2021-06-16 DIAGNOSIS — M62830 Muscle spasm of back: Secondary | ICD-10-CM | POA: Diagnosis not present

## 2021-06-16 DIAGNOSIS — M9905 Segmental and somatic dysfunction of pelvic region: Secondary | ICD-10-CM | POA: Diagnosis not present

## 2021-06-16 DIAGNOSIS — M9902 Segmental and somatic dysfunction of thoracic region: Secondary | ICD-10-CM | POA: Diagnosis not present

## 2021-06-16 DIAGNOSIS — M9903 Segmental and somatic dysfunction of lumbar region: Secondary | ICD-10-CM | POA: Diagnosis not present

## 2021-06-16 DIAGNOSIS — M546 Pain in thoracic spine: Secondary | ICD-10-CM | POA: Diagnosis not present

## 2021-06-21 DIAGNOSIS — M546 Pain in thoracic spine: Secondary | ICD-10-CM | POA: Diagnosis not present

## 2021-06-21 DIAGNOSIS — M9905 Segmental and somatic dysfunction of pelvic region: Secondary | ICD-10-CM | POA: Diagnosis not present

## 2021-06-21 DIAGNOSIS — M62830 Muscle spasm of back: Secondary | ICD-10-CM | POA: Diagnosis not present

## 2021-06-21 DIAGNOSIS — M9903 Segmental and somatic dysfunction of lumbar region: Secondary | ICD-10-CM | POA: Diagnosis not present

## 2021-06-21 DIAGNOSIS — M9902 Segmental and somatic dysfunction of thoracic region: Secondary | ICD-10-CM | POA: Diagnosis not present

## 2021-06-23 DIAGNOSIS — M9903 Segmental and somatic dysfunction of lumbar region: Secondary | ICD-10-CM | POA: Diagnosis not present

## 2021-06-23 DIAGNOSIS — M546 Pain in thoracic spine: Secondary | ICD-10-CM | POA: Diagnosis not present

## 2021-06-23 DIAGNOSIS — M9905 Segmental and somatic dysfunction of pelvic region: Secondary | ICD-10-CM | POA: Diagnosis not present

## 2021-06-23 DIAGNOSIS — M9902 Segmental and somatic dysfunction of thoracic region: Secondary | ICD-10-CM | POA: Diagnosis not present

## 2021-06-23 DIAGNOSIS — M62830 Muscle spasm of back: Secondary | ICD-10-CM | POA: Diagnosis not present

## 2021-06-26 DIAGNOSIS — M9903 Segmental and somatic dysfunction of lumbar region: Secondary | ICD-10-CM | POA: Diagnosis not present

## 2021-06-26 DIAGNOSIS — M62830 Muscle spasm of back: Secondary | ICD-10-CM | POA: Diagnosis not present

## 2021-06-26 DIAGNOSIS — M9902 Segmental and somatic dysfunction of thoracic region: Secondary | ICD-10-CM | POA: Diagnosis not present

## 2021-06-26 DIAGNOSIS — M9905 Segmental and somatic dysfunction of pelvic region: Secondary | ICD-10-CM | POA: Diagnosis not present

## 2021-06-26 DIAGNOSIS — M546 Pain in thoracic spine: Secondary | ICD-10-CM | POA: Diagnosis not present

## 2021-06-30 ENCOUNTER — Ambulatory Visit
Admission: EM | Admit: 2021-06-30 | Discharge: 2021-06-30 | Disposition: A | Discharge status: Home / Self Care | Payer: Medicare HMO | Attending: Urgent Care | Admitting: Urgent Care

## 2021-06-30 ENCOUNTER — Other Ambulatory Visit: Payer: Self-pay

## 2021-06-30 ENCOUNTER — Ambulatory Visit: Payer: Self-pay | Admitting: *Deleted

## 2021-06-30 DIAGNOSIS — N401 Enlarged prostate with lower urinary tract symptoms: Secondary | ICD-10-CM | POA: Diagnosis not present

## 2021-06-30 DIAGNOSIS — E119 Type 2 diabetes mellitus without complications: Secondary | ICD-10-CM | POA: Diagnosis not present

## 2021-06-30 DIAGNOSIS — R070 Pain in throat: Secondary | ICD-10-CM | POA: Diagnosis not present

## 2021-06-30 DIAGNOSIS — Z6836 Body mass index (BMI) 36.0-36.9, adult: Secondary | ICD-10-CM | POA: Diagnosis not present

## 2021-06-30 DIAGNOSIS — I10 Essential (primary) hypertension: Secondary | ICD-10-CM | POA: Diagnosis not present

## 2021-06-30 DIAGNOSIS — I440 Atrioventricular block, first degree: Secondary | ICD-10-CM | POA: Diagnosis not present

## 2021-06-30 DIAGNOSIS — H9201 Otalgia, right ear: Secondary | ICD-10-CM | POA: Diagnosis not present

## 2021-06-30 DIAGNOSIS — E6609 Other obesity due to excess calories: Secondary | ICD-10-CM | POA: Diagnosis not present

## 2021-06-30 DIAGNOSIS — E063 Autoimmune thyroiditis: Secondary | ICD-10-CM | POA: Diagnosis not present

## 2021-06-30 DIAGNOSIS — R001 Bradycardia, unspecified: Secondary | ICD-10-CM | POA: Diagnosis not present

## 2021-06-30 DIAGNOSIS — J069 Acute upper respiratory infection, unspecified: Secondary | ICD-10-CM | POA: Diagnosis not present

## 2021-06-30 DIAGNOSIS — T50905A Adverse effect of unspecified drugs, medicaments and biological substances, initial encounter: Secondary | ICD-10-CM | POA: Diagnosis not present

## 2021-06-30 LAB — POCT RAPID STREP A (OFFICE): Rapid Strep A Screen: NEGATIVE

## 2021-06-30 MED ORDER — CETIRIZINE HCL 10 MG PO TABS: 10.0000 mg | ORAL_TABLET | Freq: Every day | ORAL | 0 refills | Status: DC

## 2021-06-30 MED ORDER — FLUTICASONE PROPIONATE 50 MCG/ACT NA SUSP: 2.0000 | Freq: Every day | NASAL | 12 refills | Status: DC

## 2021-06-30 MED ORDER — PSEUDOEPHEDRINE HCL 30 MG PO TABS: 30.0000 mg | ORAL_TABLET | Freq: Three times a day (TID) | ORAL | 0 refills | Status: DC | PRN

## 2021-06-30 NOTE — ED Provider Notes (Signed)
?[place] ? ? ?MRN: [id number] DOB: [birth year] ? ?Subjective:  ? ?Erik Hernandez is a 71 y.o. male presenting for 1 day history of throat pain, right ear pain.  No cough, chest pain, shortness of breath, weakness, headaches, vision changes.  Has never had to see a cardiologist. ? ?No current facility-administered medications for this encounter. ? ?Current Outpatient Medications:  ?  amLODipine (NORVASC) 10 MG tablet, Take 10 mg by mouth daily., Disp: , Rfl:  ?  bisoprolol (ZEBETA) 5 MG tablet, Take 5 mg by mouth daily., Disp: , Rfl:  ?  fenofibrate 160 MG tablet, Take 160 mg by mouth daily., Disp: , Rfl:  ?  hydrochlorothiazide (HYDRODIURIL) 25 MG tablet, Take 25 mg by mouth daily., Disp: , Rfl:  ?  levothyroxine (SYNTHROID, LEVOTHROID) 25 MCG tablet, Take 25 mcg by mouth daily., Disp: , Rfl:  ?  olmesartan (BENICAR) 20 MG tablet, Take 20 mg by mouth daily., Disp: , Rfl:  ?  simvastatin (ZOCOR) 10 MG tablet, Take 10 mg by mouth daily., Disp: , Rfl:   ? ?No Known Allergies ? ?Past Medical History:  ?Diagnosis Date  ? Hyperlipidemia   ? Hypertension   ?  ? ?Past Surgical History:  ?Procedure Laterality Date  ? COLONOSCOPY W/ POLYPECTOMY    ? EYE SURGERY Bilateral 07/2012  ? cataract surgery  ? HERNIA REPAIR Right 06/13/12  ? Right inguinal hernia repair  ? KNEE SURGERY  2007  ? ? ?Family History  ?Problem Relation Age of Onset  ? Cancer Mother   ?     Breast  ? Hypertension Mother   ? Cancer Sister   ?     Breast  ? ? ?Social History  ? ?Tobacco Use  ? Smoking status: Former  ?  Packs/day: 1.00  ?  Types: Cigarettes  ?  Quit date: 05/31/2012  ?  Years since quitting: 9.0  ? Smokeless tobacco: Former  ?Substance Use Topics  ? Alcohol use: Yes  ?  Comment: moderate amounts  ? Drug use: No  ? ? ?ROS ? ? ?Objective:  ? ?Vitals: ?BP 126/61 (BP Location: Right Arm)   Pulse (!) 47   Temp 97.8 ?F (36.6 ?C) (Oral)   Resp (!) 22   SpO2 95%  ? ?Physical Exam ?Constitutional:   ?   General: He is not in  acute distress. ?   Appearance: Normal appearance. He is well-developed and normal weight. He is not ill-appearing, toxic-appearing or diaphoretic.  ?HENT:  ?   Head: Normocephalic and atraumatic.  ?   Right Ear: Tympanic membrane, ear canal and external ear normal. No drainage, swelling or tenderness. No middle ear effusion. There is no impacted cerumen. Tympanic membrane is not erythematous.  ?   Left Ear: Tympanic membrane, ear canal and external ear normal. No drainage, swelling or tenderness.  No middle ear effusion. There is no impacted cerumen. Tympanic membrane is not erythematous.  ?   Nose: Nose normal. No congestion or rhinorrhea.  ?   Mouth/Throat:  ?   Mouth: Mucous membranes are moist.  ?   Pharynx: No pharyngeal swelling, oropharyngeal exudate, posterior oropharyngeal erythema or uvula swelling.  ?Eyes:  ?   General: No scleral icterus.    ?   Right eye: No discharge.     ?   Left eye: No discharge.  ?   Extraocular Movements: Extraocular movements intact.  ?   Conjunctiva/sclera: Conjunctivae normal.  ?Cardiovascular:  ?   Rate and  Rhythm: Normal rate and regular rhythm.  ?   Heart sounds: Normal heart sounds. No murmur heard. ?  No friction rub. No gallop.  ?Pulmonary:  ?   Effort: Pulmonary effort is normal. No respiratory distress.  ?   Breath sounds: Normal breath sounds. No stridor. No wheezing, rhonchi or rales.  ?Musculoskeletal:  ?   Cervical back: Normal range of motion and neck supple. No rigidity. No muscular tenderness.  ?Neurological:  ?   General: No focal deficit present.  ?   Mental Status: He is alert and oriented to person, place, and time.  ?Psychiatric:     ?   Mood and Affect: Mood normal.     ?   Behavior: Behavior normal.     ?   Thought Content: Thought content normal.  ? ?ED ECG REPORT ? ? Date: 06/30/2021 ? EKG Time: 11:10 AM ? Rate: 47bpm ? Rhythm: sinus bradycardia,  there are no previous tracings available for comparison ? Axis: normal ? Intervals:first-degree A-V block  ?  ST&T Change: T wave flattening in lead III, T wave inversion in V2 ? Narrative Interpretation: Sinus bradycardia at 47 bpm with a first-degree AV block and nonspecific T wave changes as above.  No acute findings.  No previous EKG available for comparison. ? ?Results for orders placed or performed during the hospital encounter of 06/30/21 (from the past 24 hour(s))  ?POCT rapid strep A     Status: None  ? Collection Time: 06/30/21 11:16 AM  ?Result Value Ref Range  ? Rapid Strep A Screen Negative Negative  ? ? ?Assessment and Plan :  ? ?PDMP not reviewed this encounter. ? ?1. Viral upper respiratory infection   ?2. Right ear pain   ?3. Throat pain   ?4. First degree AV block   ? ?No signs of a bacterial infection.  Will defer COVID testing. Deferred imaging given clear cardiopulmonary exam, hemodynamically stable vital signs.  Recommended conservative management with supportive medicines for a viral upper respiratory infection versus allergic rhinitis.  Recommended follow-up with cardiology.  He does plan on following up with his PCP soon as well. Counseled patient on potential for adverse effects with medications prescribed/recommended today, ER and return-to-clinic precautions discussed, patient verbalized understanding. ? ?  ?Jaynee Eagles, PA-C ?06/30/21 1121 ? ?

## 2021-06-30 NOTE — ED Notes (Signed)
Strong bilateral radial pulse. Pt denies any chest pain, shortness breath. Reports generalized fatigue. EKG completed.PA aware.  ?

## 2021-06-30 NOTE — Telephone Encounter (Signed)
Reason for Disposition ? Heart beating very slowly (e.g., < 50 / minute)  (Exception: athlete and heart rate normal for caller) ? ?Answer Assessment - Initial Assessment Questions ?1. DESCRIPTION: "Please describe your heart rate or heartbeat that you are having" (e.g., fast/slow, regular/irregular, skipped or extra beats, "palpitations") ?    Fatigue, UC visit ?2. ONSET: "When did it start?" (Minutes, hours or days)  ?    today ?3. DURATION: "How long does it last" (e.g., seconds, minutes, hours) ?    Memory on BP machine- several weeks ?4. PATTERN "Does it come and go, or has it been constant since it started?"  "Does it get worse with exertion?"   "Are you feeling it now?" ?    constant ?5. TAP: "Using your hand, can you tap out what you are feeling on a chair or table in front of you, so that I can hear?" (Note: not all patients can do this)   ?    na ?6. HEART RATE: "Can you tell me your heart rate?" "How many beats in 15 seconds?"  (Note: not all patients can do this)   ?    48 ?7. RECURRENT SYMPTOM: "Have you ever had this before?" If Yes, ask: "When was the last time?" and "What happened that time?"  ?    Yes- chronic on BP cuff ?8. CAUSE: "What do you think is causing the palpitations?" ?    na ?9. CARDIAC HISTORY: "Do you have any history of heart disease?" (e.g., heart attack, angina, bypass surgery, angioplasty, arrhythmia)  ?    High BP treatment ?10. OTHER SYMPTOMS: "Do you have any other symptoms?" (e.g., dizziness, chest pain, sweating, difficulty breathing) ?      fatigue ?11. PREGNANCY: "Is there any chance you are pregnant?" "When was your last menstrual period?" ?      *No Answer* ? ?Protocols used: Heart Rate and Heartbeat Questions-A-AH ? ?

## 2021-06-30 NOTE — ED Triage Notes (Signed)
Pt reports right ear pain and sore throat 1 day.  ?

## 2021-06-30 NOTE — Telephone Encounter (Signed)
?  Chief Complaint: decreased heart rate ?Symptoms: fatigue ?Frequency: chronic- per patient- he has seen 48-50"s on BP machine at home for at least a month ?Pertinent Negatives: Patient denies dizziness, chest pain, sweating, difficulty breathing ?Disposition: '[x]'$ ED /'[]'$ Urgent Care (no appt availability in office) / '[]'$ Appointment(In office/virtual)/ '[]'$  [facility]/ '[]'$ Home Care/ '[]'$ Refused Recommended Disposition /'[]'$ [facility]/ '[]'$  Follow-up with PCP ?Additional Notes: Patient state she was seen at [facility] today and advised to call PCP- patient advised to do as directed- also advised per protocol- ED if can not see PCP for this  ?

## 2021-07-05 DIAGNOSIS — M62830 Muscle spasm of back: Secondary | ICD-10-CM | POA: Diagnosis not present

## 2021-07-05 DIAGNOSIS — M9905 Segmental and somatic dysfunction of pelvic region: Secondary | ICD-10-CM | POA: Diagnosis not present

## 2021-07-05 DIAGNOSIS — M9902 Segmental and somatic dysfunction of thoracic region: Secondary | ICD-10-CM | POA: Diagnosis not present

## 2021-07-05 DIAGNOSIS — M546 Pain in thoracic spine: Secondary | ICD-10-CM | POA: Diagnosis not present

## 2021-07-05 DIAGNOSIS — M9903 Segmental and somatic dysfunction of lumbar region: Secondary | ICD-10-CM | POA: Diagnosis not present

## 2021-07-19 DIAGNOSIS — Z0100 Encounter for examination of eyes and vision without abnormal findings: Secondary | ICD-10-CM | POA: Diagnosis not present

## 2021-07-19 DIAGNOSIS — H5200 Hypermetropia, unspecified eye: Secondary | ICD-10-CM | POA: Diagnosis not present

## 2021-07-24 ENCOUNTER — Encounter (HOSPITAL_COMMUNITY): Payer: Self-pay | Admitting: *Deleted

## 2021-07-24 ENCOUNTER — Emergency Department (HOSPITAL_COMMUNITY)
Admission: EM | Admit: 2021-07-24 | Discharge: 2021-07-24 | Disposition: A | Discharge status: Home / Self Care | Payer: Medicare HMO | Source: Home / Self Care | Attending: Emergency Medicine | Admitting: Emergency Medicine

## 2021-07-24 ENCOUNTER — Encounter (HOSPITAL_COMMUNITY): Payer: Self-pay

## 2021-07-24 ENCOUNTER — Other Ambulatory Visit: Payer: Self-pay

## 2021-07-24 ENCOUNTER — Emergency Department (HOSPITAL_COMMUNITY)
Admission: EM | Admit: 2021-07-24 | Discharge: 2021-07-24 | Disposition: A | Discharge status: Home / Self Care | Payer: Medicare HMO | Attending: Emergency Medicine | Admitting: Emergency Medicine

## 2021-07-24 DIAGNOSIS — I10 Essential (primary) hypertension: Secondary | ICD-10-CM | POA: Diagnosis not present

## 2021-07-24 DIAGNOSIS — R040 Epistaxis: Secondary | ICD-10-CM | POA: Insufficient documentation

## 2021-07-24 DIAGNOSIS — Z87891 Personal history of nicotine dependence: Secondary | ICD-10-CM | POA: Insufficient documentation

## 2021-07-24 NOTE — ED Triage Notes (Signed)
Occasional nose bleeds for the last 10 days. Thinks its related to his BP. It was 150/75. He says that usually its 130/70. Woke him up tonight but not bleeding now.   ?Denies pain. ?

## 2021-07-24 NOTE — ED Provider Notes (Signed)
?[facility] ?Provider Note ? ? ?CSN: [id number] ?Arrival date & time: 07/24/21  1625 ? ?  ? ?History ? ?Chief Complaint  ?Patient presents with  ? Epistaxis  ? ? ?Erik Hernandez is a 71 y.o. male. ? ? ?Epistaxis ?Patient presents for recurrence of epistaxis.  He has a remote history of epistaxis 10 years ago that was difficult to control at that time.  He reports that he has been nosebleed free up until yesterday.  Yesterday, he had an episode of nosebleed from the right nares.  He came to the ED last night and did undergo a chemical cauterization of an anterior source of oozing.  Today, he had a recurrence of bleeding from his right nares.  He was able to control his bleeding at home with packing and pressure.  On arrival in the ED, he reports continued hemostasis.  He was concerned that he may need another cauterization.  Patient does not take any antiplatelet or anticoagulant medication.  He denies any significant blood loss or symptoms of symptomatic anemia. ?  ? ?Home Medications ?Prior to Admission medications   ?Medication Sig Start Date End Date Taking? Authorizing Provider  ?amLODipine (NORVASC) 10 MG tablet Take 10 mg by mouth daily.    [provider]  ?bisoprolol (ZEBETA) 5 MG tablet Take 5 mg by mouth daily.    [provider]  ?cetirizine (ZYRTEC ALLERGY) 10 MG tablet Take 1 tablet (10 mg total) by mouth daily. 06/30/21   Jaynee Eagles, PA-C  ?fenofibrate 160 MG tablet Take 160 mg by mouth daily.    [provider]  ?fluticasone (FLONASE) 50 MCG/ACT nasal spray Place 2 sprays into both nostrils daily. 06/30/21   Jaynee Eagles, PA-C  ?hydrochlorothiazide (HYDRODIURIL) 25 MG tablet Take 25 mg by mouth daily.    [provider]  ?levothyroxine (SYNTHROID, LEVOTHROID) 25 MCG tablet Take 25 mcg by mouth daily.    [provider]  ?olmesartan (BENICAR) 20 MG tablet Take 20 mg by mouth daily.    [provider]  ?pseudoephedrine  (SUDAFED) 30 MG tablet Take 1 tablet (30 mg total) by mouth every 8 (eight) hours as needed for congestion. 06/30/21   Jaynee Eagles, PA-C  ?simvastatin (ZOCOR) 10 MG tablet Take 10 mg by mouth daily.    [provider]  ?   ? ?Allergies    ?Patient has no known allergies.   ? ?Review of Systems   ?Review of Systems  ?HENT:  Positive for nosebleeds.   ?All other systems reviewed and are negative. ? ?Physical Exam ?Updated Vital Signs ?BP 114/69 (BP Location: Right Arm)   Pulse (!) 57   Temp 97.8 ?F (36.6 ?C) (Oral)   Resp 16   Ht 6' (1.829 m)   Wt 117 kg   SpO2 100%   BMI 34.98 kg/m?  ?Physical Exam ?Vitals and nursing note reviewed.  ?Constitutional:   ?   General: He is not in acute distress. ?   Appearance: Normal appearance. He is well-developed and normal weight. He is not ill-appearing, toxic-appearing or diaphoretic.  ?HENT:  ?   Head: Normocephalic and atraumatic.  ?   Right Ear: External ear normal.  ?   Left Ear: External ear normal.  ?   Nose: Nose normal.  ?   Comments: No active bleeding at this time.  Area of prior chemical cauterization is identified on direct inspection of right septum. ?   Mouth/Throat:  ?   Mouth: Mucous  membranes are moist.  ?   Pharynx: Oropharynx is clear.  ?Eyes:  ?   Extraocular Movements: Extraocular movements intact.  ?   Conjunctiva/sclera: Conjunctivae normal.  ?Cardiovascular:  ?   Rate and Rhythm: Normal rate and regular rhythm.  ?Pulmonary:  ?   Effort: Pulmonary effort is normal. No respiratory distress.  ?Abdominal:  ?   General: There is no distension.  ?   Palpations: Abdomen is soft.  ?Musculoskeletal:     ?   General: No swelling. Normal range of motion.  ?   Cervical back: Normal range of motion and neck supple.  ?   Right lower leg: No edema.  ?   Left lower leg: No edema.  ?Skin: ?   General: Skin is warm and dry.  ?   Capillary Refill: Capillary refill takes less than 2 seconds.  ?   Coloration: Skin is not jaundiced or pale.  ?Neurological:  ?    General: No focal deficit present.  ?   Mental Status: He is alert and oriented to person, place, and time.  ?   Cranial Nerves: No cranial nerve deficit.  ?   Sensory: No sensory deficit.  ?   Motor: No weakness.  ?   Coordination: Coordination normal.  ?Psychiatric:     ?   Mood and Affect: Mood normal.     ?   Behavior: Behavior normal.     ?   Thought Content: Thought content normal.     ?   Judgment: Judgment normal.  ? ? ?ED Results / Procedures / Treatments   ?Labs ?(all labs ordered are listed, but only abnormal results are displayed) ?Labs Reviewed - No data to display ? ?EKG ?None ? ?Radiology ?No results found. ? ?Procedures ?Procedures  ? ? ?Medications Ordered in ED ?Medications - No data to display ? ?ED Course/ Medical Decision Making/ A&P ?  ?                        ?Medical Decision Making ? ?Patient is a pleasant 71 year old male who had an episode of epistaxis for which she came into the ED.  He did undergo chemical cauterization of a spot on the anterior right nasal septum on his previous visit.  Patient did have a brief recurrence of epistaxis this evening.  Hemostasis was achieved at home.  Patient presents to the ED for further evaluation.  Patient's vital signs are normal and he is well-appearing on exam.  On direct inspection of right nares, no active bleeding is present at this time. Given his description of minimal bleeding at home, I do not feel that lab work may be obtained.  Given no current bleeding, no further management is indicated.  Patient was given reassurance and counseled on management of any further nosebleeds.  He was also advised to avoid digital trauma. He is stable for discharge at this time. ? ? ? ? ? ? ? ?Final Clinical Impression(s) / ED Diagnoses ?Final diagnoses:  ?Epistaxis  ? ? ?Rx / DC Orders ?ED Discharge Orders   ? ? None  ? ?  ? ? ?  ?Godfrey Pick, MD ?07/25/21 1339 ? ?

## 2021-07-24 NOTE — ED Triage Notes (Signed)
Pt was seen here for nose bleed this am and states it started bleeding again this afternoon; bleeding controlled at this time ?

## 2021-07-24 NOTE — Discharge Instructions (Signed)
If nosebleed restarts, utilize packing and pressure to stop the bleeding.  Ensure that you expel the clot.  Afrin will constrict blood vessels in the short-term.  Avoid use of Afrin for more than 2 days.  Avoid picking at nostril to allow tissue time to heal. ?

## 2021-07-24 NOTE — ED Provider Notes (Signed)
?[facility] DEPT ?[facility] Emergency Department ?Provider Note ?MRN:  [id number]  ?Arrival date & time: 07/24/21    ? ?Chief Complaint   ?Epistaxis ?  ?History of Present Illness   ?Erik Hernandez is a 71 y.o. year-old male with a history of hypertension presenting to the ED with chief complaint of epistaxis. ? ?Intermittent epistaxis for the past several days.  Had a very bad nosebleed years ago and so he is worried about it.  Also worried about his blood pressure being a bit high when he checks it during nosebleeds.  Denies trauma to the nose, no cold-like symptoms recently, no blood thinners, no other complaints. ? ?He was told that his heart rate was low at urgent care and he is concerned about this as well. ? ?Review of Systems  ?A thorough review of systems was obtained and all systems are negative except as noted in the HPI and PMH.  ? ?Patient's Health History   ? ?Past Medical History:  ?Diagnosis Date  ? Hyperlipidemia   ? Hypertension   ?  ?Past Surgical History:  ?Procedure Laterality Date  ? COLONOSCOPY W/ POLYPECTOMY    ? EYE SURGERY Bilateral 07/2012  ? cataract surgery  ? HERNIA REPAIR Right 06/13/12  ? Right inguinal hernia repair  ? KNEE SURGERY  2007  ?  ?Family History  ?Problem Relation Age of Onset  ? Cancer Mother   ?     Breast  ? Hypertension Mother   ? Cancer Sister   ?     Breast  ?  ?Social History  ? ?Socioeconomic History  ? Marital status: Married  ?  Spouse name: Not on file  ? Number of children: Not on file  ? Years of education: Not on file  ? Highest education level: Not on file  ?Occupational History  ? Not on file  ?Tobacco Use  ? Smoking status: Former  ?  Packs/day: 1.00  ?  Types: Cigarettes  ?  Quit date: 05/31/2012  ?  Years since quitting: 9.1  ? Smokeless tobacco: Former  ?Substance and Sexual Activity  ? Alcohol use: Yes  ?  Comment: moderate amounts  ? Drug use: No  ? Sexual activity: Not Currently  ?Other Topics Concern  ? Not on file  ?Social History  Narrative  ? Not on file  ? ?Social Determinants of Health  ? ?Financial Resource Strain: Not on file  ?Food Insecurity: Not on file  ?Transportation Needs: Not on file  ?Physical Activity: Not on file  ?Stress: Not on file  ?Social Connections: Not on file  ?Intimate Partner Violence: Not on file  ?  ? ?Physical Exam  ? ?Vitals:  ? 07/24/21 0553 07/24/21 0554  ?BP:  128/80  ?Pulse:  64  ?Resp:  20  ?Temp: 98 ?F (36.7 ?C)   ?SpO2:  96%  ?  ?CONSTITUTIONAL: Well-appearing, NAD ?NEURO/PSYCH:  Alert and oriented x 3, no focal deficits ?EYES:  eyes equal and reactive ?ENT/NECK:  no LAD, no JVD ?CARDIO: Regular rate, well-perfused, normal S1 and S2 ?PULM:  CTAB no wheezing or rhonchi ?GI/GU:  non-distended, non-tender ?MSK/SPINE:  No gross deformities, no edema ?SKIN:  no rash, atraumatic ? ? ?*Additional and/or pertinent findings included in MDM below ? ?Diagnostic and Interventional Summary  ? ? EKG Interpretation ? ?Date/Time:  Monday July 24 2021 54:27:06 EDT ?Ventricular Rate:  58 ?PR Interval:  251 ?QRS Duration: 115 ?QT Interval:  417 ?QTC Calculation: 410 ?R Axis:  17 ?Text Interpretation: Sinus rhythm Prolonged PR interval Incomplete left bundle branch block Confirmed by Gerlene Fee [phone]) on 07/24/2021 6:27:07 AM ?  ? ?  ? ?Labs Reviewed - No data to display  ?No orders to display  ?  ?Medications - No data to display  ? ?Procedures  /  Critical Care ?.Epistaxis Management ? ?Date/Time: 07/24/2021 6:14 AM ?Performed by: Maudie Flakes, MD ?Authorized by: Maudie Flakes, MD  ? ?Consent:  ?  Consent obtained:  Verbal ?  Consent given by:  Patient ?  Risks, benefits, and alternatives were discussed: yes   ?  Risks discussed:  Bleeding, infection, nasal injury and pain ?  Alternatives discussed:  No treatment ?Universal protocol:  ?  Procedure explained and questions answered to patient or proxy's satisfaction: yes   ?  Immediately prior to procedure, a time out was called: yes   ?  Patient identity confirmed:   Verbally with patient ?Anesthesia:  ?  Anesthesia method:  None ?Procedure details:  ?  Treatment site:  R anterior ?  Treatment method:  Silver nitrate ?  Treatment complexity:  Limited ?  Treatment episode: initial   ?Post-procedure details:  ?  Assessment:  Bleeding stopped ?  Procedure completion:  Tolerated well, no immediate complications ? ?ED Course and Medical Decision Making  ?Initial Impression and Ddx ?Intermittent epistaxis, not currently bleeding.  There is some dried blood on the outside of the nose.  Upon visual inspection, there is an area in the right nostril on the medial nasal turbinate that appears to be a possible source of bleeding, has a small amount of bright red blood but is not actively oozing..  Chemical cauterization utilized, tolerated well. ? ?Screening EKG is reassuring, patient was concerned by what the urgent care told him about his bradycardia. ? ?Past medical/surgical history that increases complexity of ED encounter: None ? ?Interpretation of Diagnostics ?I personally reviewed the EKG and my interpretation is as follows: Incomplete bundle branch block, unchanged from prior, sinus rhythm ?   ? ? ?Patient Reassessment and Ultimate Disposition/Management ?Discharge home ? ?Patient management required discussion with the following services or consulting groups:  None ? ?Complexity of Problems Addressed ?Acute complicated illness or Injury ? ?Additional Data Reviewed and Analyzed ?Further history obtained from: ?Further history from spouse/family member ? ?Additional Factors Impacting ED Encounter Risk ?Minor Procedures ? ?[name]. Sedonia Small, MD ?[facility] Emergency Medicine ?[email or web address] ? ?Final Clinical Impressions(s) / ED Diagnoses  ? ?  ICD-10-CM   ?1. Epistaxis  R04.0   ?  ?  ?ED Discharge Orders   ? ? None  ? ?  ?  ? ?Discharge Instructions Discussed with and Provided to Patient:  ? ? ?Discharge Instructions   ? ?  ?You were evaluated in the  Emergency Department and after careful evaluation, we did not find any emergent condition requiring admission or further testing in the hospital. ? ?Your exam/testing today was overall reassuring.  Your EKG did not show any significant concerns.  We did a chemical cauterization of an area of bleeding in your right nostril here in the emergency department. ? ?Please return to the Emergency Department if you experience any worsening of your condition.  Thank you for allowing [place] to be a part of your care. ? ? ? ? ?  ?Maudie Flakes, MD ?07/24/21 [phone] ? ?

## 2021-07-24 NOTE — Discharge Instructions (Addendum)
You were evaluated in the Emergency Department and after careful evaluation, we did not find any emergent condition requiring admission or further testing in the hospital. ? ?Your exam/testing today was overall reassuring.  Your EKG did not show any significant concerns.  We did a chemical cauterization of an area of bleeding in your right nostril here in the emergency department. ? ?Please return to the Emergency Department if you experience any worsening of your condition.  Thank you for allowing [place] to be a part of your care. ? ?

## 2021-07-24 NOTE — ED Notes (Signed)
Went over d/c papers. All questions. Ambulatory to lobby with family.  ?

## 2021-07-26 ENCOUNTER — Ambulatory Visit: Payer: Self-pay

## 2021-07-26 NOTE — Telephone Encounter (Signed)
Called pt back. Pt went to Dr. Nolon Rod office and Dr Gerarda Fraction addressed his concerns about the treatment of nosebleeds. Pt stated he had all his issues answered. ?Reason for Disposition ?? Caller has already spoken with the PCP and has no further questions. ? ?Protocols used: No Contact or Duplicate Contact Call-A-AH ? ?

## 2021-09-07 DIAGNOSIS — G4733 Obstructive sleep apnea (adult) (pediatric): Secondary | ICD-10-CM | POA: Diagnosis not present

## 2021-10-13 DIAGNOSIS — M9902 Segmental and somatic dysfunction of thoracic region: Secondary | ICD-10-CM | POA: Diagnosis not present

## 2021-10-13 DIAGNOSIS — M62830 Muscle spasm of back: Secondary | ICD-10-CM | POA: Diagnosis not present

## 2021-10-13 DIAGNOSIS — M546 Pain in thoracic spine: Secondary | ICD-10-CM | POA: Diagnosis not present

## 2021-10-13 DIAGNOSIS — M9905 Segmental and somatic dysfunction of pelvic region: Secondary | ICD-10-CM | POA: Diagnosis not present

## 2021-10-13 DIAGNOSIS — M9903 Segmental and somatic dysfunction of lumbar region: Secondary | ICD-10-CM | POA: Diagnosis not present

## 2021-10-20 DIAGNOSIS — M9903 Segmental and somatic dysfunction of lumbar region: Secondary | ICD-10-CM | POA: Diagnosis not present

## 2021-10-20 DIAGNOSIS — M546 Pain in thoracic spine: Secondary | ICD-10-CM | POA: Diagnosis not present

## 2021-10-20 DIAGNOSIS — M9902 Segmental and somatic dysfunction of thoracic region: Secondary | ICD-10-CM | POA: Diagnosis not present

## 2021-10-20 DIAGNOSIS — M9905 Segmental and somatic dysfunction of pelvic region: Secondary | ICD-10-CM | POA: Diagnosis not present

## 2021-10-20 DIAGNOSIS — M62830 Muscle spasm of back: Secondary | ICD-10-CM | POA: Diagnosis not present

## 2021-10-26 DIAGNOSIS — M549 Dorsalgia, unspecified: Secondary | ICD-10-CM | POA: Diagnosis not present

## 2021-10-26 DIAGNOSIS — I10 Essential (primary) hypertension: Secondary | ICD-10-CM | POA: Diagnosis not present

## 2021-10-26 DIAGNOSIS — E063 Autoimmune thyroiditis: Secondary | ICD-10-CM | POA: Diagnosis not present

## 2021-10-26 DIAGNOSIS — Z1331 Encounter for screening for depression: Secondary | ICD-10-CM | POA: Diagnosis not present

## 2021-10-26 DIAGNOSIS — Z6836 Body mass index (BMI) 36.0-36.9, adult: Secondary | ICD-10-CM | POA: Diagnosis not present

## 2021-10-26 DIAGNOSIS — E119 Type 2 diabetes mellitus without complications: Secondary | ICD-10-CM | POA: Diagnosis not present

## 2021-10-26 DIAGNOSIS — Z0001 Encounter for general adult medical examination with abnormal findings: Secondary | ICD-10-CM | POA: Diagnosis not present

## 2021-12-12 DIAGNOSIS — G4733 Obstructive sleep apnea (adult) (pediatric): Secondary | ICD-10-CM | POA: Diagnosis not present

## 2022-01-18 DIAGNOSIS — Z23 Encounter for immunization: Secondary | ICD-10-CM | POA: Diagnosis not present

## 2022-02-27 DIAGNOSIS — N401 Enlarged prostate with lower urinary tract symptoms: Secondary | ICD-10-CM | POA: Diagnosis not present

## 2022-02-27 DIAGNOSIS — N390 Urinary tract infection, site not specified: Secondary | ICD-10-CM | POA: Diagnosis not present

## 2022-02-27 DIAGNOSIS — R1013 Epigastric pain: Secondary | ICD-10-CM | POA: Diagnosis not present

## 2022-02-27 DIAGNOSIS — I10 Essential (primary) hypertension: Secondary | ICD-10-CM | POA: Diagnosis not present

## 2022-02-27 DIAGNOSIS — N411 Chronic prostatitis: Secondary | ICD-10-CM | POA: Diagnosis not present

## 2022-02-27 DIAGNOSIS — T50905A Adverse effect of unspecified drugs, medicaments and biological substances, initial encounter: Secondary | ICD-10-CM | POA: Diagnosis not present

## 2022-02-27 DIAGNOSIS — E119 Type 2 diabetes mellitus without complications: Secondary | ICD-10-CM | POA: Diagnosis not present

## 2022-02-27 DIAGNOSIS — Z6831 Body mass index (BMI) 31.0-31.9, adult: Secondary | ICD-10-CM | POA: Diagnosis not present

## 2022-03-13 DIAGNOSIS — N390 Urinary tract infection, site not specified: Secondary | ICD-10-CM | POA: Diagnosis not present

## 2022-03-14 DIAGNOSIS — G4733 Obstructive sleep apnea (adult) (pediatric): Secondary | ICD-10-CM | POA: Diagnosis not present

## 2022-03-29 DIAGNOSIS — N390 Urinary tract infection, site not specified: Secondary | ICD-10-CM | POA: Diagnosis not present

## 2022-04-09 DIAGNOSIS — N390 Urinary tract infection, site not specified: Secondary | ICD-10-CM | POA: Diagnosis not present

## 2022-04-25 DIAGNOSIS — M79671 Pain in right foot: Secondary | ICD-10-CM | POA: Diagnosis not present

## 2022-04-25 DIAGNOSIS — M79674 Pain in right toe(s): Secondary | ICD-10-CM | POA: Diagnosis not present

## 2022-04-25 DIAGNOSIS — L600 Ingrowing nail: Secondary | ICD-10-CM | POA: Diagnosis not present

## 2022-04-25 DIAGNOSIS — L03031 Cellulitis of right toe: Secondary | ICD-10-CM | POA: Diagnosis not present

## 2022-05-08 ENCOUNTER — Ambulatory Visit (INDEPENDENT_AMBULATORY_CARE_PROVIDER_SITE_OTHER): Payer: Medicare HMO | Admitting: Urology

## 2022-05-08 ENCOUNTER — Encounter: Payer: Self-pay | Admitting: Urology

## 2022-05-08 VITALS — BP 111/74 | HR 60

## 2022-05-08 DIAGNOSIS — R8281 Pyuria: Secondary | ICD-10-CM

## 2022-05-08 DIAGNOSIS — R310 Gross hematuria: Secondary | ICD-10-CM | POA: Diagnosis not present

## 2022-05-08 LAB — URINALYSIS, ROUTINE W REFLEX MICROSCOPIC
Bilirubin, UA: NEGATIVE
Glucose, UA: NEGATIVE
Ketones, UA: NEGATIVE
Leukocytes,UA: NEGATIVE
Nitrite, UA: NEGATIVE
Protein,UA: NEGATIVE
RBC, UA: NEGATIVE
Specific Gravity, UA: 1.025 (ref 1.005–1.030)
Urobilinogen, Ur: 0.2 mg/dL (ref 0.2–1.0)
pH, UA: 5.5 (ref 5.0–7.5)

## 2022-05-08 NOTE — Progress Notes (Signed)
H&P  Chief Complaint: Abdominal pain, dysuria, hematuria  History of Present Illness: 72 year old male sent by Dr. Wyatt Mage for evaluation and management of recent lower urinary tract symptoms.  The patient states that over the past 3 months he has had intermittent lower abdominal pain, frequency, urgency, dysuria, pneumaturia.  He was on 3 courses of antibiotics for presumed urinary tract infection.  He did have dark urine on more than 1 occasion and was noted to have hematuria on urinalyses.  Currently, he has no significant lower urinary tract symptoms, no dysuria and has not had pneumaturia in a while.  His abdominal pain has resolved.  Past Medical History:  Diagnosis Date   Hyperlipidemia    Hypertension     Past Surgical History:  Procedure Laterality Date   COLONOSCOPY W/ POLYPECTOMY     EYE SURGERY Bilateral 07/2012   cataract surgery   HERNIA REPAIR Right 06/13/12   Right inguinal hernia repair   KNEE SURGERY  2007    Home Medications:  Allergies as of 05/08/2022   No Known Allergies      Medication List        Accurate as of May 08, 2022  8:15 AM. If you have any questions, ask your nurse or doctor.          amLODipine 10 MG tablet Commonly known as: NORVASC Take 10 mg by mouth daily.   bisoprolol 5 MG tablet Commonly known as: ZEBETA Take 5 mg by mouth daily.   cetirizine 10 MG tablet Commonly known as: ZyrTEC Allergy Take 1 tablet (10 mg total) by mouth daily.   fenofibrate 160 MG tablet Take 160 mg by mouth daily.   fluticasone 50 MCG/ACT nasal spray Commonly known as: FLONASE Place 2 sprays into both nostrils daily.   hydrochlorothiazide 25 MG tablet Commonly known as: HYDRODIURIL Take 25 mg by mouth daily.   levothyroxine 25 MCG tablet Commonly known as: SYNTHROID Take 25 mcg by mouth daily.   olmesartan 20 MG tablet Commonly known as: BENICAR Take 20 mg by mouth daily.   pseudoephedrine 30 MG tablet Commonly known as:  SUDAFED Take 1 tablet (30 mg total) by mouth every 8 (eight) hours as needed for congestion.   simvastatin 10 MG tablet Commonly known as: ZOCOR Take 10 mg by mouth daily.        Allergies: No Known Allergies  Family History  Problem Relation Age of Onset   Cancer Mother        Breast   Hypertension Mother    Cancer Sister        Breast    Social History:  reports that he quit smoking about 9 years ago. His smoking use included cigarettes. He smoked an average of 1 pack per day. He has quit using smokeless tobacco. He reports current alcohol use. He reports that he does not use drugs.  ROS: A complete review of systems was performed.  All systems are negative except for pertinent findings as noted.  Physical Exam:  Vital signs in last 24 hours: There were no vitals taken for this visit. Constitutional:  Alert and oriented, No acute distress Cardiovascular: Regular rate  Respiratory: Normal respiratory effort GI: Abdomen is soft, nontender, obese.  Well-healed surgical scar in lower midline.  No mass. Genitourinary: Normal male phallus, testes are descended bilaterally and non-tender and without masses, scrotum is normal in appearance without lesions or masses, perineum is normal on inspection.  Normal anal sphincter tone prostate 30 g, symmetric, nonnodular,  nontender. Lymphatic: No lymphadenopathy Neurologic: Grossly intact, no focal deficits Psychiatric: Normal mood and affect  I have reviewed notes from referring/previous physicians  I have reviewed urinalysis results  I have independently reviewed prior imaging  37 pages of old records reviewed.  I only saw 1 urinalysis, no urine cultures   Impression/Assessment:  Lower urinary tract symptoms with pneumaturia and abdominal pain, resolved now.  I am strongly suspicious for colovesical fistula which is now inactive  Plan:  1.  I discussed with the patient the fact that he may well of had a fistula and talk to him  about the probable etiologic agent  2.  I will get a CT scan of the abdomen and pelvis with oral contrast in the near future  3.  More than likely, I will eventually recommend he see a gastroenterologist

## 2022-05-09 DIAGNOSIS — M79674 Pain in right toe(s): Secondary | ICD-10-CM | POA: Diagnosis not present

## 2022-05-09 DIAGNOSIS — L03032 Cellulitis of left toe: Secondary | ICD-10-CM | POA: Diagnosis not present

## 2022-05-09 DIAGNOSIS — M79671 Pain in right foot: Secondary | ICD-10-CM | POA: Diagnosis not present

## 2022-05-09 DIAGNOSIS — L600 Ingrowing nail: Secondary | ICD-10-CM | POA: Diagnosis not present

## 2022-05-11 DIAGNOSIS — R310 Gross hematuria: Secondary | ICD-10-CM | POA: Diagnosis not present

## 2022-05-12 LAB — BASIC METABOLIC PANEL
BUN/Creatinine Ratio: 21 (ref 10–24)
BUN: 25 mg/dL (ref 8–27)
CO2: 21 mmol/L (ref 20–29)
Calcium: 10.4 mg/dL — ABNORMAL HIGH (ref 8.6–10.2)
Chloride: 101 mmol/L (ref 96–106)
Creatinine, Ser: 1.17 mg/dL (ref 0.76–1.27)
Glucose: 109 mg/dL — ABNORMAL HIGH (ref 70–99)
Potassium: 4.5 mmol/L (ref 3.5–5.2)
Sodium: 139 mmol/L (ref 134–144)
eGFR: 67 mL/min/{1.73_m2} (ref >59)

## 2022-05-14 ENCOUNTER — Telehealth: Payer: Self-pay

## 2022-05-14 NOTE — Telephone Encounter (Signed)
-----   Message from Franchot Gallo, MD sent at 05/14/2022  8:42 AM EST ----- Let pt know blood chemistries/kidney function normal. ----- Message ----- From: Sherrilyn Rist, CMA Sent: 05/14/2022   8:38 AM EST To: Franchot Gallo, MD  Please review

## 2022-05-14 NOTE — Telephone Encounter (Signed)
Made patient aware that his blood chemistries/kidney function normal. Patient voiced understanding

## 2022-06-04 ENCOUNTER — Encounter (HOSPITAL_COMMUNITY): Payer: Self-pay | Admitting: Radiology

## 2022-06-04 ENCOUNTER — Ambulatory Visit (HOSPITAL_COMMUNITY)
Admission: RE | Admit: 2022-06-04 | Discharge: 2022-06-04 | Disposition: A | Discharge status: Home / Self Care | Payer: Medicare HMO | Source: Ambulatory Visit | Attending: Urology | Admitting: Urology

## 2022-06-04 DIAGNOSIS — N281 Cyst of kidney, acquired: Secondary | ICD-10-CM | POA: Diagnosis not present

## 2022-06-04 DIAGNOSIS — R310 Gross hematuria: Secondary | ICD-10-CM | POA: Insufficient documentation

## 2022-06-04 DIAGNOSIS — K8020 Calculus of gallbladder without cholecystitis without obstruction: Secondary | ICD-10-CM | POA: Diagnosis not present

## 2022-06-04 MED ORDER — IOHEXOL 300 MG/ML  SOLN
100.0000 mL | Freq: Once | INTRAMUSCULAR | Status: AC | PRN
  Administered 2022-06-04: 100 mL via INTRAVENOUS

## 2022-06-06 DIAGNOSIS — E039 Hypothyroidism, unspecified: Secondary | ICD-10-CM | POA: Diagnosis not present

## 2022-06-06 DIAGNOSIS — R7989 Other specified abnormal findings of blood chemistry: Secondary | ICD-10-CM | POA: Diagnosis not present

## 2022-06-11 ENCOUNTER — Telehealth: Payer: Self-pay

## 2022-06-11 NOTE — Telephone Encounter (Signed)
-----   Message from Franchot Gallo, MD sent at 06/08/2022 12:44 PM EST ----- Please call pt--he does have significant diverticulosis, which may be the reason he is having gas in his urination. I would suggest that he see a GI MD. If he has one have him make an appt. If he does not have one I can schedule a consultation ----- Message ----- From: Sherrilyn Rist, CMA Sent: 06/05/2022   4:07 PM EST To: Franchot Gallo, MD  Please review

## 2022-06-11 NOTE — Telephone Encounter (Signed)
Patient is aware that he has significant diverticulosis and may be the reason of having gas in urination. Patient states he do not have a GI MD and will need a consultation scheduled. Patient is aware that I will send the MD a task. Patient voiced understanding

## 2022-06-12 ENCOUNTER — Other Ambulatory Visit: Payer: Self-pay | Admitting: Urology

## 2022-06-12 DIAGNOSIS — K5790 Diverticulosis of intestine, part unspecified, without perforation or abscess without bleeding: Secondary | ICD-10-CM

## 2022-06-14 DIAGNOSIS — G4733 Obstructive sleep apnea (adult) (pediatric): Secondary | ICD-10-CM | POA: Diagnosis not present

## 2022-06-14 NOTE — Telephone Encounter (Signed)
Return call to patient. Patient is aware Dr. Diona Fanti put referral order in to GI specialist. Patient states that he has spoke with GI specialist office and have appt for next week. Patient voiced understanding

## 2022-06-19 NOTE — Progress Notes (Signed)
Referring Provider: Franchot Gallo, MD  Primary Care Physician:  Redmond School, MD Primary Gastroenterologist:  Dr. Gala Romney  Chief Complaint  Patient presents with   Abdominal Pain    Stomach pains, constipation some blood in urine.    HPI:   Erik Hernandez is a 72 y.o. male presenting today at the request of Franchot Gallo, MD for diverticulosis, possible colovesical fistula.   Patient was seen by Dr. Diona Fanti 05/08/2022 at the request of PCP for evaluation of recently lower urinary tract symptoms.  Patient reported over the last 3 months, he has had intermittent lower abdominal pain, frequency, urgency, dysuria, pneumaturia.  He was on 3 courses of antibiotics for presumed urinary tract infection. Reported dark urine on more than 1 occasion and was noted to have hematuria on urinalysis.  He was currently having no lower urinary tract symptoms, dysuria, or hematuria.  Abdominal pain had resolved.  Suspected colovesical fistula.  Plan for CT A/P.  CT A/P with contrast 06/04/2022: Colovesical fistula between anterior bladder dome and adjacent sigmoid colon, severe colonic diverticulosis without diverticulitis, cholelithiasis without cholecystitis, 1 cm indeterminate subcapsular lesion in posterior midpole of the left kidney which could be due to solid neoplasm or hemorrhagic cyst with recommendations for abdominal MRI with and without contrast, mildly enlarged prostate, small left foraminal hernia containing fat only.  Today:  Had been passing gas in his urine along with blood and coffee colored urine.  Associated abdominal pain.  Completed 3 courses of antibiotics and all symptoms resolved.  Reports few months ago, he started Ozempic for weight loss and states he had every side effect possible.  States he became very constipated and feels this was the start of all of his problems.  He did lose weight, but discontinued Ozempic as he could not tolerate the GI side effects.  Bowels  have now returned to normal.  Denies ever having BRBPR or melena.  He has continued to work on weight loss by eating smaller portions.   Last colonoscopy 04/19/2010.  Sessile polyp lesion on ileocecal valve, protruding neoplasm at appendiceal orifice s/p multiple biopsies, sessile polyp in the transverse colon removed, extensive diverticular disease in the sigmoid colon, pedunculated polyp and sessile polyp in sigmoid colon.  Pathology from cecal/appendiceal biopsies revealed tubular adenoma and sessile serrated adenoma.  Also had tubular adenoma in the transverse colon and sessile serrated adenoma in the sigmoid colon.    He later underwent laparoscopic right partial colectomy of the terminal ileum and ascending colon in March 2012. Pathology with 1.5 cm sessile serrated adenoma, small hyperplastic polyp, 14 negative lymph nodes.  Past Medical History:  Diagnosis Date   Hyperlipidemia    Hypertension     Past Surgical History:  Procedure Laterality Date   COLON SURGERY     Right hemicolectomy; Pathology with 1.5 cm sessile serrated adenoma, small hyperplastic polyp, 14 negative lymph nodes.   COLONOSCOPY W/ POLYPECTOMY     Sessile polyp on IC valve, protruding neoplasm at appendiceal orifice s/p biopsies, sessile polyp in transverse colon, extensive diverticular disease in the sigmoid colon, pedunculated polyp and sessile polyp in sigmoid colon.  Pathology from cecal/appendiceal biopsies with tubular adenoma, sessile serrated adenoma.  Also with sessile serrated adenoma in sigmoid and tubular adenoma in transverse   EYE SURGERY Bilateral 07/01/2012   cataract surgery   HERNIA REPAIR Right 06/13/2012   Right inguinal hernia repair   KNEE SURGERY  04/02/2005    Current Outpatient Medications  Medication Sig Dispense Refill  diltiazem (CARDIZEM CD) 180 MG 24 hr capsule Take 180 mg by mouth daily.     fenofibrate 160 MG tablet Take 160 mg by mouth daily.     fluticasone (FLONASE) 50  MCG/ACT nasal spray Place 2 sprays into both nostrils daily. 16 g 12   hydrochlorothiazide (HYDRODIURIL) 25 MG tablet Take 25 mg by mouth daily.     levothyroxine (SYNTHROID, LEVOTHROID) 25 MCG tablet Take 25 mcg by mouth daily.     olmesartan (BENICAR) 20 MG tablet Take 20 mg by mouth daily.     simvastatin (ZOCOR) 10 MG tablet Take 10 mg by mouth daily.     polyethylene glycol-electrolytes (NULYTELY) 420 g solution Take 4,000 mLs by mouth once for 1 dose. 4000 mL 0   No current facility-administered medications for this visit.    Allergies as of 06/21/2022   (No Known Allergies)    Family History  Problem Relation Age of Onset   Cancer Mother        Breast   Hypertension Mother    Cancer Sister        Breast    Social History   Socioeconomic History   Marital status: Married    Spouse name: Not on file   Number of children: Not on file   Years of education: Not on file   Highest education level: Not on file  Occupational History   Not on file  Tobacco Use   Smoking status: Former    Packs/day: 1    Types: Cigarettes    Quit date: 05/31/2012    Years since quitting: 10.0   Smokeless tobacco: Former  Substance and Sexual Activity   Alcohol use: Yes    Comment: moderate amounts   Drug use: No   Sexual activity: Not Currently  Other Topics Concern   Not on file  Social History Narrative   Not on file   Social Determinants of Health   Financial Resource Strain: Not on file  Food Insecurity: Not on file  Transportation Needs: Not on file  Physical Activity: Not on file  Stress: Not on file  Social Connections: Not on file  Intimate Partner Violence: Not on file    Review of Systems: Gen: Denies any fever, chills, cold or flulike symptoms, presyncope, syncope. CV: Denies chest pain, heart palpitations. Resp: Denies shortness of breath, cough. GI: See HPI GU : Denies urinary burning, urinary frequency, urinary hesitancy MS: Denies joint pain. Derm: Denies  rash. Psych: Denies depression, anxiety. Heme: See HPI  Physical Exam: BP 119/75 (BP Location: Right Arm, Patient Position: Sitting, Cuff Size: Normal)   Pulse (!) 56   Temp 97.6 F (36.4 C) (Temporal)   Ht 5\' 11"  (1.803 m)   Wt 220 lb 9.6 oz (100.1 kg)   SpO2 98%   BMI 30.77 kg/m  General:   Alert and oriented. Pleasant and cooperative. Well-nourished and well-developed.  Head:  Normocephalic and atraumatic. Eyes:  Without icterus, sclera clear and conjunctiva pink.  Ears:  Normal auditory acuity. Lungs:  Clear to auscultation bilaterally. No wheezes, rales, or rhonchi. No distress.  Heart:  S1, S2 present without murmurs appreciated.  Abdomen:  +BS, soft, non-tender and non-distended. No HSM noted. No guarding or rebound. No masses appreciated.  Rectal:  Deferred  Msk:  Symmetrical without gross deformities. Normal posture. Extremities:  Without edema. Neurologic:  Alert and  oriented x4;  grossly normal neurologically. Skin:  Intact without significant lesions or rashes. Psych:  Normal mood and  affect.    Assessment:  72 year old male with history of HTN, HLD, hypothyroidism, adenomatous colon polyps, right hemicolectomy due to large cecal polyp in 2012, presenting today at the request of Dr. Diona Fanti for diverticulosis, possible colovesical fistula.   Patient had previously been experiencing pneumaturia, hematuria, dysuria, along with coffee colored urine and lower abdominal pain s/p 3 rounds of antibiotics with improvement in symptoms, but later underwent CT A/P with contrast 06/04/2022 revealing colovesical fistula between anterior bladder dome and adjacent sigmoid colon, severe colonic diverticulosis without diverticulitis.  Clinically, he is doing very well at this time with no significant GI symptoms or urinary symptoms.  He had previously been experiencing significant constipation in the setting of Ozempic for weight loss, but Ozempic has been discontinued and bowels have  normalized.   We discussed scheduling a colonoscopy for further evaluation of possible colovesical fistula to ensure there are no lesions within the colon in the area including neoplasms that may have caused the fistula. He is also due for colonoscopy due to his history of colon polyps as his last colonoscopy was in 2012 with adenomatous polyps including a 1.5 cm sessile serrated adenoma at the cecum that was actually resected at the time of right hemicolectomy.  Unclear if he will need to see general surgery for colovesical fistula as he is now asymptomatic. Will proceed with colonoscopy first and see what Dr. Gala Romney recommends at that time.    Plan:  Proceed with colonoscopy with propofol by Dr. Gala Romney in near future. The risks, benefits, and alternatives have been discussed with the patient in detail. The patient states understanding and desires to proceed.  ASA 2 Unclear if patient will need to see general surgery for colovesical fistula. Will defer to Dr. Roseanne Kaufman recommendations at time of colonoscopy.  Follow-up after procedure.    Aliene Altes, PA-C [facility] 06/21/2022

## 2022-06-19 NOTE — H&P (View-Only) (Signed)
Referring Provider: Franchot Gallo, MD  Primary Care Physician:  Redmond School, MD Primary Gastroenterologist:  Dr. Gala Romney  Chief Complaint  Patient presents with   Abdominal Pain    Stomach pains, constipation some blood in urine.    HPI:   Erik Hernandez is a 72 y.o. male presenting today at the request of Franchot Gallo, MD for diverticulosis, possible colovesical fistula.   Patient was seen by Dr. Diona Fanti 05/08/2022 at the request of PCP for evaluation of recently lower urinary tract symptoms.  Patient reported over the last 3 months, he has had intermittent lower abdominal pain, frequency, urgency, dysuria, pneumaturia.  He was on 3 courses of antibiotics for presumed urinary tract infection. Reported dark urine on more than 1 occasion and was noted to have hematuria on urinalysis.  He was currently having no lower urinary tract symptoms, dysuria, or hematuria.  Abdominal pain had resolved.  Suspected colovesical fistula.  Plan for CT A/P.  CT A/P with contrast 06/04/2022: Colovesical fistula between anterior bladder dome and adjacent sigmoid colon, severe colonic diverticulosis without diverticulitis, cholelithiasis without cholecystitis, 1 cm indeterminate subcapsular lesion in posterior midpole of the left kidney which could be due to solid neoplasm or hemorrhagic cyst with recommendations for abdominal MRI with and without contrast, mildly enlarged prostate, small left foraminal hernia containing fat only.  Today:  Had been passing gas in his urine along with blood and coffee colored urine.  Associated abdominal pain.  Completed 3 courses of antibiotics and all symptoms resolved.  Reports few months ago, he started Ozempic for weight loss and states he had every side effect possible.  States he became very constipated and feels this was the start of all of his problems.  He did lose weight, but discontinued Ozempic as he could not tolerate the GI side effects.  Bowels  have now returned to normal.  Denies ever having BRBPR or melena.  He has continued to work on weight loss by eating smaller portions.   Last colonoscopy 04/19/2010.  Sessile polyp lesion on ileocecal valve, protruding neoplasm at appendiceal orifice s/p multiple biopsies, sessile polyp in the transverse colon removed, extensive diverticular disease in the sigmoid colon, pedunculated polyp and sessile polyp in sigmoid colon.  Pathology from cecal/appendiceal biopsies revealed tubular adenoma and sessile serrated adenoma.  Also had tubular adenoma in the transverse colon and sessile serrated adenoma in the sigmoid colon.    He later underwent laparoscopic right partial colectomy of the terminal ileum and ascending colon in March 2012. Pathology with 1.5 cm sessile serrated adenoma, small hyperplastic polyp, 14 negative lymph nodes.  Past Medical History:  Diagnosis Date   Hyperlipidemia    Hypertension     Past Surgical History:  Procedure Laterality Date   COLON SURGERY     Right hemicolectomy; Pathology with 1.5 cm sessile serrated adenoma, small hyperplastic polyp, 14 negative lymph nodes.   COLONOSCOPY W/ POLYPECTOMY     Sessile polyp on IC valve, protruding neoplasm at appendiceal orifice s/p biopsies, sessile polyp in transverse colon, extensive diverticular disease in the sigmoid colon, pedunculated polyp and sessile polyp in sigmoid colon.  Pathology from cecal/appendiceal biopsies with tubular adenoma, sessile serrated adenoma.  Also with sessile serrated adenoma in sigmoid and tubular adenoma in transverse   EYE SURGERY Bilateral 07/01/2012   cataract surgery   HERNIA REPAIR Right 06/13/2012   Right inguinal hernia repair   KNEE SURGERY  04/02/2005    Current Outpatient Medications  Medication Sig Dispense Refill  diltiazem (CARDIZEM CD) 180 MG 24 hr capsule Take 180 mg by mouth daily.     fenofibrate 160 MG tablet Take 160 mg by mouth daily.     fluticasone (FLONASE) 50  MCG/ACT nasal spray Place 2 sprays into both nostrils daily. 16 g 12   hydrochlorothiazide (HYDRODIURIL) 25 MG tablet Take 25 mg by mouth daily.     levothyroxine (SYNTHROID, LEVOTHROID) 25 MCG tablet Take 25 mcg by mouth daily.     olmesartan (BENICAR) 20 MG tablet Take 20 mg by mouth daily.     simvastatin (ZOCOR) 10 MG tablet Take 10 mg by mouth daily.     polyethylene glycol-electrolytes (NULYTELY) 420 g solution Take 4,000 mLs by mouth once for 1 dose. 4000 mL 0   No current facility-administered medications for this visit.    Allergies as of 06/21/2022   (No Known Allergies)    Family History  Problem Relation Age of Onset   Cancer Mother        Breast   Hypertension Mother    Cancer Sister        Breast    Social History   Socioeconomic History   Marital status: Married    Spouse name: Not on file   Number of children: Not on file   Years of education: Not on file   Highest education level: Not on file  Occupational History   Not on file  Tobacco Use   Smoking status: Former    Packs/day: 1    Types: Cigarettes    Quit date: 05/31/2012    Years since quitting: 10.0   Smokeless tobacco: Former  Substance and Sexual Activity   Alcohol use: Yes    Comment: moderate amounts   Drug use: No   Sexual activity: Not Currently  Other Topics Concern   Not on file  Social History Narrative   Not on file   Social Determinants of Health   Financial Resource Strain: Not on file  Food Insecurity: Not on file  Transportation Needs: Not on file  Physical Activity: Not on file  Stress: Not on file  Social Connections: Not on file  Intimate Partner Violence: Not on file    Review of Systems: Gen: Denies any fever, chills, cold or flulike symptoms, presyncope, syncope. CV: Denies chest pain, heart palpitations. Resp: Denies shortness of breath, cough. GI: See HPI GU : Denies urinary burning, urinary frequency, urinary hesitancy MS: Denies joint pain. Derm: Denies  rash. Psych: Denies depression, anxiety. Heme: See HPI  Physical Exam: BP 119/75 (BP Location: Right Arm, Patient Position: Sitting, Cuff Size: Normal)   Pulse (!) 56   Temp 97.6 F (36.4 C) (Temporal)   Ht 5\' 11"  (1.803 m)   Wt 220 lb 9.6 oz (100.1 kg)   SpO2 98%   BMI 30.77 kg/m  General:   Alert and oriented. Pleasant and cooperative. Well-nourished and well-developed.  Head:  Normocephalic and atraumatic. Eyes:  Without icterus, sclera clear and conjunctiva pink.  Ears:  Normal auditory acuity. Lungs:  Clear to auscultation bilaterally. No wheezes, rales, or rhonchi. No distress.  Heart:  S1, S2 present without murmurs appreciated.  Abdomen:  +BS, soft, non-tender and non-distended. No HSM noted. No guarding or rebound. No masses appreciated.  Rectal:  Deferred  Msk:  Symmetrical without gross deformities. Normal posture. Extremities:  Without edema. Neurologic:  Alert and  oriented x4;  grossly normal neurologically. Skin:  Intact without significant lesions or rashes. Psych:  Normal mood and  affect.    Assessment:  72 year old male with history of HTN, HLD, hypothyroidism, adenomatous colon polyps, right hemicolectomy due to large cecal polyp in 2012, presenting today at the request of Dr. Diona Fanti for diverticulosis, possible colovesical fistula.   Patient had previously been experiencing pneumaturia, hematuria, dysuria, along with coffee colored urine and lower abdominal pain s/p 3 rounds of antibiotics with improvement in symptoms, but later underwent CT A/P with contrast 06/04/2022 revealing colovesical fistula between anterior bladder dome and adjacent sigmoid colon, severe colonic diverticulosis without diverticulitis.  Clinically, he is doing very well at this time with no significant GI symptoms or urinary symptoms.  He had previously been experiencing significant constipation in the setting of Ozempic for weight loss, but Ozempic has been discontinued and bowels have  normalized.   We discussed scheduling a colonoscopy for further evaluation of possible colovesical fistula to ensure there are no lesions within the colon in the area including neoplasms that may have caused the fistula. He is also due for colonoscopy due to his history of colon polyps as his last colonoscopy was in 2012 with adenomatous polyps including a 1.5 cm sessile serrated adenoma at the cecum that was actually resected at the time of right hemicolectomy.  Unclear if he will need to see general surgery for colovesical fistula as he is now asymptomatic. Will proceed with colonoscopy first and see what Dr. Gala Romney recommends at that time.    Plan:  Proceed with colonoscopy with propofol by Dr. Gala Romney in near future. The risks, benefits, and alternatives have been discussed with the patient in detail. The patient states understanding and desires to proceed.  ASA 2 Unclear if patient will need to see general surgery for colovesical fistula. Will defer to Dr. Roseanne Kaufman recommendations at time of colonoscopy.  Follow-up after procedure.    Aliene Altes, PA-C [facility] 06/21/2022

## 2022-06-21 ENCOUNTER — Other Ambulatory Visit: Payer: Self-pay | Admitting: *Deleted

## 2022-06-21 ENCOUNTER — Encounter: Payer: Self-pay | Admitting: Gastroenterology

## 2022-06-21 ENCOUNTER — Encounter: Payer: Self-pay | Admitting: *Deleted

## 2022-06-21 ENCOUNTER — Ambulatory Visit: Payer: Medicare HMO | Admitting: Gastroenterology

## 2022-06-21 VITALS — BP 119/75 | HR 56 | Temp 97.6°F | Ht 71.0 in | Wt 220.6 lb

## 2022-06-21 DIAGNOSIS — Z860100 Personal history of colon polyps, unspecified: Secondary | ICD-10-CM | POA: Insufficient documentation

## 2022-06-21 DIAGNOSIS — N321 Vesicointestinal fistula: Secondary | ICD-10-CM

## 2022-06-21 MED ORDER — PEG 3350-KCL-NA BICARB-NACL 420 G PO SOLR: 4000.0000 mL | Freq: Once | ORAL | 0 refills | Status: AC

## 2022-06-21 NOTE — Patient Instructions (Signed)
We will arrange you to have a colonoscopy with Dr. Gala Romney in the near future.  Will follow-up with you in the office after your procedure.  Do not hesitate to call sooner if you have questions or concerns.  Is was very nice to meet you today!  Aliene Altes, PA-C [facility]

## 2022-06-28 ENCOUNTER — Other Ambulatory Visit (HOSPITAL_COMMUNITY)
Admission: RE | Admit: 2022-06-28 | Discharge: 2022-06-28 | Disposition: A | Discharge status: Home / Self Care | Payer: Medicare HMO | Source: Ambulatory Visit | Attending: Internal Medicine | Admitting: Internal Medicine

## 2022-06-28 DIAGNOSIS — Z86010 Personal history of colonic polyps: Secondary | ICD-10-CM | POA: Diagnosis not present

## 2022-06-28 LAB — BASIC METABOLIC PANEL
Anion gap: 7 (ref 5–15)
BUN: 29 mg/dL — ABNORMAL HIGH (ref 8–23)
CO2: 25 mmol/L (ref 22–32)
Calcium: 9.5 mg/dL (ref 8.9–10.3)
Chloride: 103 mmol/L (ref 98–111)
Creatinine, Ser: 1.26 mg/dL — ABNORMAL HIGH (ref 0.61–1.24)
GFR, Estimated: >60 mL/min (ref >60)
Glucose, Bld: 110 mg/dL — ABNORMAL HIGH (ref 70–99)
Potassium: 3.5 mmol/L (ref 3.5–5.1)
Sodium: 135 mmol/L (ref 135–145)

## 2022-07-05 ENCOUNTER — Ambulatory Visit (HOSPITAL_COMMUNITY)
Admission: RE | Admit: 2022-07-05 | Discharge: 2022-07-05 | Disposition: A | Discharge status: Home / Self Care | Payer: Medicare HMO | Source: Ambulatory Visit | Attending: Internal Medicine | Admitting: Internal Medicine

## 2022-07-05 ENCOUNTER — Ambulatory Visit (HOSPITAL_COMMUNITY): Payer: Medicare HMO | Admitting: Anesthesiology

## 2022-07-05 ENCOUNTER — Encounter (HOSPITAL_COMMUNITY)
Admission: RE | Disposition: A | Discharge status: Home / Self Care | Payer: Self-pay | Source: Ambulatory Visit | Attending: Internal Medicine

## 2022-07-05 ENCOUNTER — Other Ambulatory Visit: Payer: Self-pay

## 2022-07-05 ENCOUNTER — Ambulatory Visit (HOSPITAL_BASED_OUTPATIENT_CLINIC_OR_DEPARTMENT_OTHER): Payer: Medicare HMO | Admitting: Anesthesiology

## 2022-07-05 ENCOUNTER — Encounter (HOSPITAL_COMMUNITY): Payer: Self-pay | Admitting: Internal Medicine

## 2022-07-05 DIAGNOSIS — E785 Hyperlipidemia, unspecified: Secondary | ICD-10-CM | POA: Diagnosis not present

## 2022-07-05 DIAGNOSIS — Z87891 Personal history of nicotine dependence: Secondary | ICD-10-CM | POA: Insufficient documentation

## 2022-07-05 DIAGNOSIS — I10 Essential (primary) hypertension: Secondary | ICD-10-CM | POA: Insufficient documentation

## 2022-07-05 DIAGNOSIS — D127 Benign neoplasm of rectosigmoid junction: Secondary | ICD-10-CM | POA: Insufficient documentation

## 2022-07-05 DIAGNOSIS — D128 Benign neoplasm of rectum: Secondary | ICD-10-CM

## 2022-07-05 DIAGNOSIS — D123 Benign neoplasm of transverse colon: Secondary | ICD-10-CM | POA: Diagnosis not present

## 2022-07-05 DIAGNOSIS — K5730 Diverticulosis of large intestine without perforation or abscess without bleeding: Secondary | ICD-10-CM | POA: Diagnosis not present

## 2022-07-05 DIAGNOSIS — R3989 Other symptoms and signs involving the genitourinary system: Secondary | ICD-10-CM | POA: Diagnosis not present

## 2022-07-05 DIAGNOSIS — Z86010 Personal history of colonic polyps: Secondary | ICD-10-CM | POA: Diagnosis not present

## 2022-07-05 DIAGNOSIS — R319 Hematuria, unspecified: Secondary | ICD-10-CM | POA: Diagnosis not present

## 2022-07-05 DIAGNOSIS — K5790 Diverticulosis of intestine, part unspecified, without perforation or abscess without bleeding: Secondary | ICD-10-CM

## 2022-07-05 DIAGNOSIS — K621 Rectal polyp: Secondary | ICD-10-CM

## 2022-07-05 DIAGNOSIS — E039 Hypothyroidism, unspecified: Secondary | ICD-10-CM | POA: Diagnosis not present

## 2022-07-05 DIAGNOSIS — Z9049 Acquired absence of other specified parts of digestive tract: Secondary | ICD-10-CM | POA: Insufficient documentation

## 2022-07-05 DIAGNOSIS — K635 Polyp of colon: Secondary | ICD-10-CM

## 2022-07-05 DIAGNOSIS — Z1211 Encounter for screening for malignant neoplasm of colon: Secondary | ICD-10-CM | POA: Diagnosis not present

## 2022-07-05 HISTORY — PX: POLYPECTOMY: SHX5525

## 2022-07-05 HISTORY — PX: COLONOSCOPY WITH PROPOFOL: SHX5780

## 2022-07-05 SURGERY — COLONOSCOPY WITH PROPOFOL
Anesthesia: General

## 2022-07-05 MED ORDER — PROPOFOL 500 MG/50ML IV EMUL
INTRAVENOUS | Status: DC | PRN
  Administered 2022-07-05: 100 ug/kg/min via INTRAVENOUS

## 2022-07-05 MED ORDER — PROPOFOL 10 MG/ML IV BOLUS
INTRAVENOUS | Status: DC | PRN
  Administered 2022-07-05: 170 mg via INTRAVENOUS
  Administered 2022-07-05 (×2): 20 mg via INTRAVENOUS

## 2022-07-05 MED ORDER — LACTATED RINGERS IV SOLN: INTRAVENOUS | Status: DC

## 2022-07-05 NOTE — Interval H&P Note (Signed)
History and Physical Interval Note:  07/05/2022 9:55 AM  Erik Hernandez  has presented today for surgery, with the diagnosis of history of polyps.  The various methods of treatment have been discussed with the patient and family. After consideration of risks, benefits and other options for treatment, the patient has consented to  Procedure(s) with comments: COLONOSCOPY WITH PROPOFOL (N/A) - 10:30 am as a surgical intervention.  The patient's history has been reviewed, patient examined, no change in status, stable for surgery.  I have reviewed the patient's chart and labs.  Questions were answered to the patient's satisfaction.     Erik Hernandez  No change.  Single episode of compelling for hematuria.  No abdominal pain.  Overdue for surveillance colonoscopy.  Colonoscopy today per plan.  The risks, benefits, limitations, alternatives and imponderables have been reviewed with the patient. Questions have been answered. All parties are agreeable.

## 2022-07-05 NOTE — Transfer of Care (Signed)
Immediate Anesthesia Transfer of Care Note  Patient: Erik Hernandez  Procedure(s) Performed: COLONOSCOPY WITH PROPOFOL POLYPECTOMY  Patient Location: Endoscopy Unit  Anesthesia Type:General  Level of Consciousness: sedated and patient cooperative  Airway & Oxygen Therapy: Patient Spontanous Breathing  Post-op Assessment: Report given to RN and Post -op Vital signs reviewed and stable  Post vital signs: Reviewed and stable  Last Vitals:  Vitals Value Taken Time  BP 104/55 07/05/22 1038  Temp 36.8 C 07/05/22 1038  Pulse 67 07/05/22 1038  Resp 13 07/05/22 1038  SpO2 98 % 07/05/22 1038    Last Pain:  Vitals:   07/05/22 1038  TempSrc: Axillary  PainSc:       Patients Stated Pain Goal: 7 ([id number])  Complications: No notable events documented.

## 2022-07-05 NOTE — Op Note (Signed)
[facility] Patient Name: Erik Hernandez Procedure Date: 07/05/2022 9:53 AM MRN: [id number] Date of Birth: [birth year] Attending MD: Norvel Richards , MD, [id number] CSN: [id number] Age: 72 Admit Type: Outpatient Procedure:                Colonoscopy Indications:              High risk colon cancer surveillance: Personal                            history of colonic polyps; recent self-limiting                            pneumaturia; history of multiple colonic adenomas                            status post right hemicolectomy 2012 Providers:                Norvel Richards, MD, Rosina Lowenstein, RN, Raphael Gibney, Technician Referring MD:              Medicines:                Propofol per Anesthesia Complications:            No immediate complications. Estimated Blood Loss:     Estimated blood loss was minimal. Procedure:                Pre-Anesthesia Assessment:                           - Prior to the procedure, a History and Physical                            was performed, and patient medications and                            allergies were reviewed. The patient's tolerance of                            previous anesthesia was also reviewed. The risks                            and benefits of the procedure and the sedation                            options and risks were discussed with the patient.                            All questions were answered, and informed consent                            was obtained. Prior Anticoagulants: The patient has  taken no anticoagulant or antiplatelet agents. ASA                            Grade Assessment: III - A patient with severe                            systemic disease. After reviewing the risks and                            benefits, the patient was deemed in satisfactory                            condition to undergo the procedure.                            After obtaining informed consent, the colonoscope                            was passed under direct vision. Throughout the                            procedure, the patient's blood pressure, pulse, and                            oxygen saturations were monitored continuously. The                            [id number]) scope was introduced through the                            anus and advanced to the the ileocolonic                            anastomosis. The colonoscopy was performed without                            difficulty. The patient tolerated the procedure                            well. The quality of the bowel preparation was                            adequate. The rectum was photographed. Scope In: 10:08:44 AM Scope Out: 10:33:31 AM Scope Withdrawal Time: 0 hours 15 minutes 5 seconds  Total Procedure Duration: 0 hours 24 minutes 47 seconds  Findings:      The perianal and digital rectal examinations were normal.      Three sessile polyps were found in the recto-sigmoid colon and splenic       flexure. The polyps were 4 to 6 mm in size. These polyps were removed       with a cold snare. Resection and retrieval were complete. Estimated       blood loss was minimal.      Two semi-pedunculated polyps were found in the distal rectum and splenic  flexure. The polyps were 7 to 9 mm in size. These polyps were removed       with a hot snare. Resection and retrieval were complete. Estimated blood       loss: none.      Many large-mouthed diverticula were found in the entire colon. Very       dense sigmoid -complex (tics within tics). Focal area of Diverticula       petechiae. Mid sigmoid. No tumor or fistula tract opening identified.      The exam was otherwise without abnormality on direct and retroflexion       views. Impression:               - Three 4 to 6 mm polyps at the recto-sigmoid colon                            and at the splenic flexure, removed with a  cold                            snare. Resected and retrieved.                           - Two 7 to 9 mm polyps in the distal rectum and at                            the splenic flexure, removed with a hot snare.                            Resected and retrieved.                           - Diverticulosis in the entire examined colon.                            Dense diverticulosis in the sigmoid with.                            Diverticula petechiae as described                           - The examination was otherwise normal on direct                            and retroflexion views.                           -Cannot exclude diverticular colitis or                            diverticulitis in the past. It is notable he had                            quite a bit of difficulties with constipation while                            on Ozempic recently.                           -  Cannot exclude an occult fistula. However, it is                            good to know the patient only had the single                            episode of pneumaturia and now states no urologic                            symptoms. Moderate Sedation:      Moderate (conscious) sedation was personally administered by an       anesthesia professional. The following parameters were monitored: oxygen       saturation, heart rate, blood pressure, respiratory rate, EKG, adequacy       of pulmonary ventilation, and response to care. Recommendation:           - Patient has a contact number available for                            emergencies. The signs and symptoms of potential                            delayed complications were discussed with the                            patient. Return to normal activities tomorrow.                            Written discharge instructions were provided to the                            patient.                           - Resume previous diet.                           -  Continue present medications. Follow-up with Dr.                            Diona Fanti. Begin Benefiber 1 tablespoon daily x 3                            weeks; then increase to 2 tablespoons thereafter.                           - Repeat colonoscopy date to be determined after                            pending pathology results are reviewed for                            surveillance.                           -  Return to GI office (date not yet determined). Procedure Code(s):        --- Professional ---                           [phone], Colonoscopy, flexible; with removal of                            tumor(s), polyp(s), or other lesion(s) by snare                            technique Diagnosis Code(s):        --- Professional ---                           Z86.010, Personal history of colonic polyps                           D12.7, Benign neoplasm of rectosigmoid junction                           D12.8, Benign neoplasm of rectum                           D12.3, Benign neoplasm of transverse colon (hepatic                            flexure or splenic flexure)                           K57.30, Diverticulosis of large intestine without                            perforation or abscess without bleeding CPT copyright 2022 [employer]. All rights reserved. The codes documented in this report are preliminary and upon coder review may  be revised to meet current compliance requirements. Cristopher Estimable. Marthe Dant, MD Norvel Richards, MD 07/05/2022 10:51:45 AM This report has been signed electronically. Number of Addenda: 0

## 2022-07-05 NOTE — Discharge Instructions (Addendum)
  Colonoscopy Discharge Instructions  Read the instructions outlined below and refer to this sheet in the next few weeks. These discharge instructions provide you with general information on caring for yourself after you leave the hospital. Your doctor may also give you specific instructions. While your treatment has been planned according to the most current medical practices available, unavoidable complications occasionally occur. If you have any problems or questions after discharge, call Dr. Gala Hernandez at [phone]. ACTIVITY You may resume your regular activity, but move at a slower pace for the next 24 hours.  Take frequent rest periods for the next 24 hours.  Walking will help get rid of the air and reduce the bloated feeling in your belly (abdomen).  No driving for 24 hours (because of the medicine (anesthesia) used during the test).   Do not sign any important legal documents or operate any machinery for 24 hours (because of the anesthesia used during the test).  NUTRITION Drink plenty of fluids.  You may resume your normal diet as instructed by your doctor.  Begin with a light meal and progress to your normal diet. Heavy or fried foods are harder to digest and may make you feel sick to your stomach (nauseated).  Avoid alcoholic beverages for 24 hours or as instructed.  MEDICATIONS You may resume your normal medications unless your doctor tells you otherwise.  WHAT YOU CAN EXPECT TODAY Some feelings of bloating in the abdomen.  Passage of more gas than usual.  Spotting of blood in your stool or on the toilet paper.  IF YOU HAD POLYPS REMOVED DURING THE COLONOSCOPY: No aspirin products for 7 days or as instructed.  No alcohol for 7 days or as instructed.  Eat a soft diet for the next 24 hours.  FINDING OUT THE RESULTS OF YOUR TEST Not all test results are available during your visit. If your test results are not back during the visit, make an appointment with your caregiver to find out the  results. Do not assume everything is normal if you have not heard from your caregiver or the medical facility. It is important for you to follow up on all of your test results.  SEEK IMMEDIATE MEDICAL ATTENTION IF: You have more than a spotting of blood in your stool.  Your belly is swollen (abdominal distention).  You are nauseated or vomiting.  You have a temperature over 101.  You have abdominal pain or discomfort that is severe or gets worse throughout the day.     You have many, many diverticuli in your colon.  Also, multiple polyps removed.  I did not find a fistula (Does not mean you do not have 1)  No tumor seen.  Further recommendations to follow pending review of pathology report on polyps  Begin Benefiber 1 tablespoon daily for 3 weeks; then increase to 2 tablespoons every day thereafter.  Take indefinitely.  Please keep your follow-up appointment with Dr. Diona Hernandez  At patient request I called Erik Hernandez at [phone] -reviewed findings and recommendations

## 2022-07-05 NOTE — Anesthesia Preprocedure Evaluation (Signed)
Anesthesia Evaluation  Patient identified by MRN, date of birth, ID band Patient awake    Reviewed: Allergy & Precautions, H&P , NPO status , Patient's Chart, lab work & pertinent test results, reviewed documented beta blocker date and time   Airway Mallampati: II  TM Distance: >3 FB Neck ROM: full    Dental no notable dental hx.    Pulmonary neg pulmonary ROS, former smoker   Pulmonary exam normal breath sounds clear to auscultation       Cardiovascular Exercise Tolerance: Good hypertension, negative cardio ROS  Rhythm:regular Rate:Normal     Neuro/Psych negative neurological ROS  negative psych ROS   GI/Hepatic negative GI ROS, Neg liver ROS,,,  Endo/Other  negative endocrine ROS    Renal/GU negative Renal ROS  negative genitourinary   Musculoskeletal   Abdominal   Peds  Hematology negative hematology ROS (+)   Anesthesia Other Findings   Reproductive/Obstetrics negative OB ROS                             Anesthesia Physical Anesthesia Plan  ASA: 2  Anesthesia Plan: General   Post-op Pain Management:    Induction:   PONV Risk Score and Plan: Propofol infusion  Airway Management Planned:   Additional Equipment:   Intra-op Plan:   Post-operative Plan:   Informed Consent: I have reviewed the patients History and Physical, chart, labs and discussed the procedure including the risks, benefits and alternatives for the proposed anesthesia with the patient or authorized representative who has indicated his/her understanding and acceptance.     Dental Advisory Given  Plan Discussed with: CRNA  Anesthesia Plan Comments:        Anesthesia Quick Evaluation  

## 2022-07-06 LAB — SURGICAL PATHOLOGY

## 2022-07-06 NOTE — Anesthesia Postprocedure Evaluation (Signed)
Anesthesia Post Note  Patient: Erik Hernandez  Procedure(s) Performed: COLONOSCOPY WITH PROPOFOL POLYPECTOMY  Patient location during evaluation: Phase II Anesthesia Type: General Level of consciousness: awake Pain management: pain level controlled Vital Signs Assessment: post-procedure vital signs reviewed and stable Respiratory status: spontaneous breathing and respiratory function stable Cardiovascular status: blood pressure returned to baseline and stable Postop Assessment: no headache and no apparent nausea or vomiting Anesthetic complications: no Comments: Late entry   No notable events documented.   Last Vitals:  Vitals:   07/05/22 1038 07/05/22 1040  BP: (!) 104/55 (!) 91/58  Pulse: 67 72  Resp: 13 16  Temp: 36.8 C   SpO2: 98% 99%    Last Pain:  Vitals:   07/05/22 1040  TempSrc:   PainSc: 0-No pain                 Windell Norfolk

## 2022-07-07 ENCOUNTER — Encounter: Payer: Self-pay | Admitting: Internal Medicine

## 2022-07-09 NOTE — Progress Notes (Signed)
History of Present Illness: This man is here today for follow-up of probable colovesical fistula.  Initial visit 2 months ago.  At that time he was having discolored urine, dysuria and pneumaturia.  CT abdomen and pelvis performed on 06/04/2022-  -Colovesical fistula between the anterior bladder dome and adjacent sigmoid colon -Severe colonic diverticulosis, without radiographic evidence of active diverticulitis.  -Cholelithiasis. No radiographic evidence of cholecystitis.  -1.0 cm indeterminate subcapsular lesion in posterior midpole of left   kidney, which could be due to solid neoplasm or hemorrhagic cyst.   Recommend abdomen MRI without and with contrast for further   characterization.  -Mildly enlarged prostate.  -Small left femoral hernia, which contains only fat.  -Aortic Atherosclerosis (ICD10-I70.0).  Past Medical History:  Diagnosis Date   Hyperlipidemia    Hypertension     Past Surgical History:  Procedure Laterality Date   COLON SURGERY     Right hemicolectomy; Pathology with 1.5 cm sessile serrated adenoma, small hyperplastic polyp, 14 negative lymph nodes.   COLONOSCOPY W/ POLYPECTOMY     Sessile polyp on IC valve, protruding neoplasm at appendiceal orifice s/p biopsies, sessile polyp in transverse colon, extensive diverticular disease in the sigmoid colon, pedunculated polyp and sessile polyp in sigmoid colon.  Pathology from cecal/appendiceal biopsies with tubular adenoma, sessile serrated adenoma.  Also with sessile serrated adenoma in sigmoid and tubular adenoma in transverse   EYE SURGERY Bilateral 07/01/2012   cataract surgery   HERNIA REPAIR Right 06/13/2012   Right inguinal hernia repair   KNEE SURGERY  04/02/2005    Home Medications:  Allergies as of 07/10/2022   No Known Allergies      Medication List        Accurate as of July 09, 2022  8:37 PM. If you have any questions, ask your nurse or doctor.          diltiazem 120 MG 24 hr capsule Commonly  known as: CARDIZEM CD Take 120 mg by mouth daily.   diltiazem 120 MG tablet Commonly known as: CARDIZEM Take 120 mg by mouth 2 (two) times daily.   fenofibrate 160 MG tablet Take 160 mg by mouth every evening.   Fish Oil 1000 MG Caps Take 1,000 mg by mouth daily.   Flaxseed Oil 1200 MG Caps Take 1,200 mg by mouth daily.   folic acid 1 MG tablet Commonly known as: FOLVITE Take 1 mg by mouth daily.   hydrochlorothiazide 25 MG tablet Commonly known as: HYDRODIURIL Take 25 mg by mouth 2 (two) times daily.   levothyroxine 50 MCG tablet Commonly known as: SYNTHROID Take 50 mcg by mouth daily before breakfast.   olmesartan 40 MG tablet Commonly known as: BENICAR Take 40 mg by mouth daily.   simvastatin 40 MG tablet Commonly known as: ZOCOR Take 40 mg by mouth every evening.        Allergies: No Known Allergies  Family History  Problem Relation Age of Onset   Cancer Mother        Breast   Hypertension Mother    Cancer Sister        Breast    Social History:  reports that he quit smoking about 10 years ago. His smoking use included cigarettes. He smoked an average of 1 pack per day. He has quit using smokeless tobacco. He reports current alcohol use. He reports that he does not use drugs.  ROS: A complete review of systems was performed.  All systems are negative except for pertinent  findings as noted.  Physical Exam:  Vital signs in last 24 hours: There were no vitals taken for this visit. Constitutional:  Alert and oriented, No acute distress Cardiovascular: Regular rate  Respiratory: Normal respiratory effort Neurologic: Grossly intact, no focal deficits Psychiatric: Normal mood and affect  I have reviewed prior pt notes  I have reviewed notes from referring/previous physicians  I have reviewed urinalysis results  I have independently reviewed prior imaging--CT A/P images   I have reviewed prior urine culture   Impression/Assessment:   ***  Plan:  ***

## 2022-07-10 ENCOUNTER — Encounter: Payer: Self-pay | Admitting: Urology

## 2022-07-10 ENCOUNTER — Ambulatory Visit: Payer: Medicare HMO | Admitting: Urology

## 2022-07-10 VITALS — BP 121/71 | HR 63

## 2022-07-10 DIAGNOSIS — N321 Vesicointestinal fistula: Secondary | ICD-10-CM | POA: Diagnosis not present

## 2022-07-10 LAB — URINALYSIS, ROUTINE W REFLEX MICROSCOPIC
Bilirubin, UA: NEGATIVE
Glucose, UA: NEGATIVE
Ketones, UA: NEGATIVE
Nitrite, UA: NEGATIVE
Protein,UA: NEGATIVE
RBC, UA: NEGATIVE
Specific Gravity, UA: 1.015 (ref 1.005–1.030)
Urobilinogen, Ur: 0.2 mg/dL (ref 0.2–1.0)
pH, UA: 5.5 (ref 5.0–7.5)

## 2022-07-10 LAB — MICROSCOPIC EXAMINATION
Bacteria, UA: NONE SEEN
WBC, UA: >30 /hpf — AB (ref 0–5)

## 2022-07-10 MED ORDER — CIPROFLOXACIN HCL 500 MG PO TABS
500.0000 mg | ORAL_TABLET | Freq: Once | ORAL | Status: AC
Start: 2022-07-10 — End: ?

## 2022-07-12 ENCOUNTER — Encounter (HOSPITAL_COMMUNITY): Payer: Self-pay | Admitting: Internal Medicine

## 2022-09-04 ENCOUNTER — Ambulatory Visit: Payer: Medicare HMO | Admitting: Urology

## 2022-09-04 ENCOUNTER — Encounter: Payer: Self-pay | Admitting: Urology

## 2022-09-04 VITALS — BP 132/70 | HR 62

## 2022-09-04 DIAGNOSIS — R8281 Pyuria: Secondary | ICD-10-CM | POA: Diagnosis not present

## 2022-09-04 DIAGNOSIS — R3989 Other symptoms and signs involving the genitourinary system: Secondary | ICD-10-CM

## 2022-09-04 DIAGNOSIS — K5790 Diverticulosis of intestine, part unspecified, without perforation or abscess without bleeding: Secondary | ICD-10-CM

## 2022-09-04 DIAGNOSIS — R310 Gross hematuria: Secondary | ICD-10-CM

## 2022-09-04 DIAGNOSIS — N321 Vesicointestinal fistula: Secondary | ICD-10-CM

## 2022-09-04 LAB — URINALYSIS, ROUTINE W REFLEX MICROSCOPIC
Bilirubin, UA: NEGATIVE
Glucose, UA: NEGATIVE
Ketones, UA: NEGATIVE
Leukocytes,UA: NEGATIVE
Nitrite, UA: NEGATIVE
Protein,UA: NEGATIVE
RBC, UA: NEGATIVE
Specific Gravity, UA: 1.02 (ref 1.005–1.030)
Urobilinogen, Ur: 0.2 mg/dL (ref 0.2–1.0)
pH, UA: 6 (ref 5.0–7.5)

## 2022-09-04 NOTE — Progress Notes (Signed)
History of Present Illness:   This man is here today for follow-up of probable colovesical fistula.  Initial visit 2.6.2024.  At that time he was having discolored urine, dysuria and pneumaturia.  CT abdomen and pelvis performed on 06/04/2022-   -Colovesical fistula between the anterior bladder dome and adjacent sigmoid colon -Severe colonic diverticulosis, without radiographic evidence of active diverticulitis.  -Cholelithiasis. No radiographic evidence of cholecystitis.  -1.0 cm indeterminate subcapsular lesion in posterior midpole of left   kidney, which could be due to solid neoplasm or hemorrhagic cyst.   Recommend abdomen MRI without and with contrast for further   characterization.  -Mildly enlarged prostate.  -Small left femoral hernia, which contains only fat.  -Aortic Atherosclerosis (ICD10-I70.0).   4.4.2024: Colonoscopy by Dr. Jena Gauss.  No definitive fistula seen.  Multiple diverticula, polyps biopsied.   4.9.2024: Here for recheck.  He has not had pneumaturia since his last visit.  He did have gas in his bladder on his CT scan a month ago.  Cystoscopy was unremarkable.  6.4.2024: He has not had any real voiding issues.  No gross hematuria, no more pneumaturia, no dysuria.    Past Medical History:  Diagnosis Date   Hyperlipidemia    Hypertension     Past Surgical History:  Procedure Laterality Date   COLON SURGERY     Right hemicolectomy; Pathology with 1.5 cm sessile serrated adenoma, small hyperplastic polyp, 14 negative lymph nodes.   COLONOSCOPY W/ POLYPECTOMY     Sessile polyp on IC valve, protruding neoplasm at appendiceal orifice s/p biopsies, sessile polyp in transverse colon, extensive diverticular disease in the sigmoid colon, pedunculated polyp and sessile polyp in sigmoid colon.  Pathology from cecal/appendiceal biopsies with tubular adenoma, sessile serrated adenoma.  Also with sessile serrated adenoma in sigmoid and tubular adenoma in transverse   COLONOSCOPY  WITH PROPOFOL N/A 07/05/2022   Procedure: COLONOSCOPY WITH PROPOFOL;  Surgeon: Corbin Ade, MD;  Location: [facility];  Service: Endoscopy;  Laterality: N/A;  10:30 am   EYE SURGERY Bilateral 07/01/2012   cataract surgery   HERNIA REPAIR Right 06/13/2012   Right inguinal hernia repair   KNEE SURGERY  04/02/2005   POLYPECTOMY  07/05/2022   Procedure: POLYPECTOMY;  Surgeon: Corbin Ade, MD;  Location: [facility];  Service: Endoscopy;;  hot and cold snare    Home Medications:  Allergies as of 09/04/2022   No Known Allergies      Medication List        Accurate as of September 04, 2022  8:12 AM. If you have any questions, ask your nurse or doctor.          diltiazem 120 MG 24 hr capsule Commonly known as: CARDIZEM CD Take 120 mg by mouth daily.   diltiazem 120 MG tablet Commonly known as: CARDIZEM Take 120 mg by mouth 2 (two) times daily.   fenofibrate 160 MG tablet Take 160 mg by mouth every evening.   Fish Oil 1000 MG Caps Take 1,000 mg by mouth daily.   Flaxseed Oil 1200 MG Caps Take 1,200 mg by mouth daily.   folic acid 1 MG tablet Commonly known as: FOLVITE Take 1 mg by mouth daily.   hydrochlorothiazide 25 MG tablet Commonly known as: HYDRODIURIL Take 25 mg by mouth 2 (two) times daily.   levothyroxine 50 MCG tablet Commonly known as: SYNTHROID Take 50 mcg by mouth daily before breakfast.   olmesartan 40 MG tablet Commonly known as: BENICAR Take 40  mg by mouth daily.   simvastatin 40 MG tablet Commonly known as: ZOCOR Take 40 mg by mouth every evening.        Allergies: No Known Allergies  Family History  Problem Relation Age of Onset   Cancer Mother        Breast   Hypertension Mother    Cancer Sister        Breast    Social History:  reports that he quit smoking about 10 years ago. His smoking use included cigarettes. He smoked an average of 1 pack per day. He has quit using smokeless tobacco. He reports current alcohol use. He  reports that he does not use drugs.  ROS: A complete review of systems was performed.  All systems are negative except for pertinent findings as noted.  Physical Exam:  Vital signs in last 24 hours: There were no vitals taken for this visit. Constitutional:  Alert and oriented, No acute distress Cardiovascular: Regular rate  Respiratory: Normal respiratory effort Neurologic: Grossly intact, no focal deficits Psychiatric: Normal mood and affect  I have reviewed prior pt notes  I have reviewed notes from referring/previous physicians  I have reviewed urinalysis results--clear today  I have independently reviewed prior imaging--prior CT results reviewed  I have reviewed prior urine culture   Impression/Assessment:  History of pneumaturia, possible colovesical fistula, currently asymptomatic  Hematuria, essentially negative evaluation  Plan:  At this point, I do not think he needs any further evaluation with a clear urine and minimal urinary symptomatology  I have cautioned him that if he does have a fistula this may represent  I will have him come back in a year for recheck t

## 2022-09-12 DIAGNOSIS — D2271 Melanocytic nevi of right lower limb, including hip: Secondary | ICD-10-CM | POA: Diagnosis not present

## 2022-09-12 DIAGNOSIS — Z1283 Encounter for screening for malignant neoplasm of skin: Secondary | ICD-10-CM | POA: Diagnosis not present

## 2022-09-12 DIAGNOSIS — D485 Neoplasm of uncertain behavior of skin: Secondary | ICD-10-CM | POA: Diagnosis not present

## 2022-09-12 DIAGNOSIS — D225 Melanocytic nevi of trunk: Secondary | ICD-10-CM | POA: Diagnosis not present

## 2022-10-31 DIAGNOSIS — I10 Essential (primary) hypertension: Secondary | ICD-10-CM | POA: Diagnosis not present

## 2022-10-31 DIAGNOSIS — R7309 Other abnormal glucose: Secondary | ICD-10-CM | POA: Diagnosis not present

## 2022-10-31 DIAGNOSIS — Z0001 Encounter for general adult medical examination with abnormal findings: Secondary | ICD-10-CM | POA: Diagnosis not present

## 2022-10-31 DIAGNOSIS — E6609 Other obesity due to excess calories: Secondary | ICD-10-CM | POA: Diagnosis not present

## 2022-10-31 DIAGNOSIS — Z1331 Encounter for screening for depression: Secondary | ICD-10-CM | POA: Diagnosis not present

## 2022-10-31 DIAGNOSIS — N410 Acute prostatitis: Secondary | ICD-10-CM | POA: Diagnosis not present

## 2022-10-31 DIAGNOSIS — N401 Enlarged prostate with lower urinary tract symptoms: Secondary | ICD-10-CM | POA: Diagnosis not present

## 2022-10-31 DIAGNOSIS — Z6830 Body mass index (BMI) 30.0-30.9, adult: Secondary | ICD-10-CM | POA: Diagnosis not present

## 2022-11-06 DIAGNOSIS — E119 Type 2 diabetes mellitus without complications: Secondary | ICD-10-CM | POA: Diagnosis not present

## 2022-11-06 DIAGNOSIS — Z0001 Encounter for general adult medical examination with abnormal findings: Secondary | ICD-10-CM | POA: Diagnosis not present

## 2022-11-06 DIAGNOSIS — E559 Vitamin D deficiency, unspecified: Secondary | ICD-10-CM | POA: Diagnosis not present

## 2022-11-06 DIAGNOSIS — D518 Other vitamin B12 deficiency anemias: Secondary | ICD-10-CM | POA: Diagnosis not present

## 2022-11-06 DIAGNOSIS — Z125 Encounter for screening for malignant neoplasm of prostate: Secondary | ICD-10-CM | POA: Diagnosis not present

## 2022-11-06 DIAGNOSIS — E039 Hypothyroidism, unspecified: Secondary | ICD-10-CM | POA: Diagnosis not present

## 2022-12-10 NOTE — Progress Notes (Addendum)
History of Present Illness:   This man is here today for follow-up of probable colovesical fistula.  Initial visit 2.6.2024.  At that time he was having discolored urine, dysuria and pneumaturia.  CT abdomen and pelvis performed on 06/04/2022-   -Colovesical fistula between the anterior bladder dome and adjacent sigmoid colon -Severe colonic diverticulosis, without radiographic evidence of active diverticulitis.  -Cholelithiasis. No radiographic evidence of cholecystitis.  -1.0 cm indeterminate subcapsular lesion in posterior midpole of left   kidney, which could be due to solid neoplasm or hemorrhagic cyst.   Recommend abdomen MRI without and with contrast for further   characterization.  -Mildly enlarged prostate.  -Small left femoral hernia, which contains only fat.  -Aortic Atherosclerosis (ICD10-I70.0).   4.4.2024: Colonoscopy by Dr. Jena Gauss.  No definitive fistula seen.  Multiple diverticula, polyps biopsied.   4.9.2024: Here for recheck.  He has not had pneumaturia since his last visit.  He did have gas in his bladder on his CT scan a month ago.  Cystoscopy was unremarkable.  6.4.2024: He has not had any real voiding issues.  No gross hematuria, no more pneumaturia, no dysuria.  9.10.2024: Here for routine follow-up.  He did have a PSA drawn at his PCP last month that was 5.0.  I have no old PSA data.  It is first visit in February of this year prostate was felt to be normal in size without distinguishing features.  He has had no dysuria and denies significant LUTS.  IPSS 4/2.  Past Medical History:  Diagnosis Date   Hyperlipidemia    Hypertension     Past Surgical History:  Procedure Laterality Date   COLON SURGERY     Right hemicolectomy; Pathology with 1.5 cm sessile serrated adenoma, small hyperplastic polyp, 14 negative lymph nodes.   COLONOSCOPY W/ POLYPECTOMY     Sessile polyp on IC valve, protruding neoplasm at appendiceal orifice s/p biopsies, sessile polyp in  transverse colon, extensive diverticular disease in the sigmoid colon, pedunculated polyp and sessile polyp in sigmoid colon.  Pathology from cecal/appendiceal biopsies with tubular adenoma, sessile serrated adenoma.  Also with sessile serrated adenoma in sigmoid and tubular adenoma in transverse   COLONOSCOPY WITH PROPOFOL N/A 07/05/2022   Procedure: COLONOSCOPY WITH PROPOFOL;  Surgeon: Corbin Ade, MD;  Location: [facility];  Service: Endoscopy;  Laterality: N/A;  10:30 am   EYE SURGERY Bilateral 07/01/2012   cataract surgery   HERNIA REPAIR Right 06/13/2012   Right inguinal hernia repair   KNEE SURGERY  04/02/2005   POLYPECTOMY  07/05/2022   Procedure: POLYPECTOMY;  Surgeon: Corbin Ade, MD;  Location: [facility];  Service: Endoscopy;;  hot and cold snare    Home Medications:  Allergies as of 12/11/2022   No Known Allergies      Medication List        Accurate as of December 10, 2022  7:02 PM. If you have any questions, ask your nurse or doctor.          diltiazem 120 MG 24 hr capsule Commonly known as: CARDIZEM CD Take 120 mg by mouth daily.   diltiazem 120 MG tablet Commonly known as: CARDIZEM Take 120 mg by mouth 2 (two) times daily.   fenofibrate 160 MG tablet Take 160 mg by mouth every evening.   Fish Oil 1000 MG Caps Take 1,000 mg by mouth daily.   Flaxseed Oil 1200 MG Caps Take 1,200 mg by mouth daily.   folic acid 1 MG  tablet Commonly known as: FOLVITE Take 1 mg by mouth daily.   hydrochlorothiazide 25 MG tablet Commonly known as: HYDRODIURIL Take 25 mg by mouth 2 (two) times daily.   levothyroxine 50 MCG tablet Commonly known as: SYNTHROID Take 50 mcg by mouth daily before breakfast.   olmesartan 40 MG tablet Commonly known as: BENICAR Take 40 mg by mouth daily.   simvastatin 40 MG tablet Commonly known as: ZOCOR Take 40 mg by mouth every evening.        Allergies: No Known Allergies  Family History  Problem Relation Age of  Onset   Cancer Mother        Breast   Hypertension Mother    Cancer Sister        Breast    Social History:  reports that he quit smoking about 10 years ago. His smoking use included cigarettes. He has quit using smokeless tobacco. He reports current alcohol use. He reports that he does not use drugs.  ROS: A complete review of systems was performed.  All systems are negative except for pertinent findings as noted.  Physical Exam:  Vital signs in last 24 hours: There were no vitals taken for this visit. Constitutional:  Alert and oriented, No acute distress Cardiovascular: Regular rate  Respiratory: Normal respiratory effort Neurologic: Grossly intact, no focal deficits Psychiatric: Normal mood and affect  I have reviewed prior pt notes  Referring PCP notes reviewed as well as 36 pages of labs  I have reviewed urinalysis results-clear  I have independently reviewed prior imaging--CT A/P  I have reviewed prior PSA results-recently 5.0    Impression/Assessment:  1.  History of LUTS/dysuria/pneumaturia, resolved.  Suspicious for a fistula but patient not currently symptomatic and has negative cystoscopy  2.  Mild elevation of PSA with normal exam  Plan:  1.  Patient currently reassured about exam.  I will let him know that I have reviewed his PSA sent by Dr. Carlena Sax  2.  I will have him come back in 28-months for recheck of PSA

## 2022-12-11 ENCOUNTER — Encounter: Payer: Self-pay | Admitting: Urology

## 2022-12-11 ENCOUNTER — Ambulatory Visit: Payer: Medicare HMO | Admitting: Urology

## 2022-12-11 VITALS — BP 146/77 | HR 63 | Temp 97.9°F

## 2022-12-11 DIAGNOSIS — R310 Gross hematuria: Secondary | ICD-10-CM

## 2022-12-11 DIAGNOSIS — R8281 Pyuria: Secondary | ICD-10-CM | POA: Diagnosis not present

## 2022-12-11 DIAGNOSIS — K5790 Diverticulosis of intestine, part unspecified, without perforation or abscess without bleeding: Secondary | ICD-10-CM

## 2022-12-11 DIAGNOSIS — R972 Elevated prostate specific antigen [PSA]: Secondary | ICD-10-CM

## 2022-12-11 LAB — URINALYSIS, ROUTINE W REFLEX MICROSCOPIC
Bilirubin, UA: NEGATIVE
Glucose, UA: NEGATIVE
Ketones, UA: NEGATIVE
Leukocytes,UA: NEGATIVE
Nitrite, UA: NEGATIVE
RBC, UA: NEGATIVE
Specific Gravity, UA: 1.02 (ref 1.005–1.030)
Urobilinogen, Ur: 1 mg/dL (ref 0.2–1.0)
pH, UA: 6 (ref 5.0–7.5)

## 2022-12-12 ENCOUNTER — Telehealth: Payer: Self-pay

## 2022-12-12 NOTE — Telephone Encounter (Signed)
-----   Message from Bertram Millard Dahlstedt sent at 12/11/2022 12:07 PM EDT ----- Call patient-I did receive PSA result-it was 5, just slightly elevated.  No need for any action now.  I would like to see him in 4 months after PSA drawn-I put that order in

## 2022-12-12 NOTE — Telephone Encounter (Signed)
Tried calling patient with no answer, left vm for return call to office

## 2022-12-19 IMAGING — DX DG CHEST 2V
2 series · 2 of 2 positions shown · non-contrast
Comparison: 02/18/2019

CLINICAL DATA: Chronic cough

EXAM:
CHEST - 2 VIEW

[chest pa]
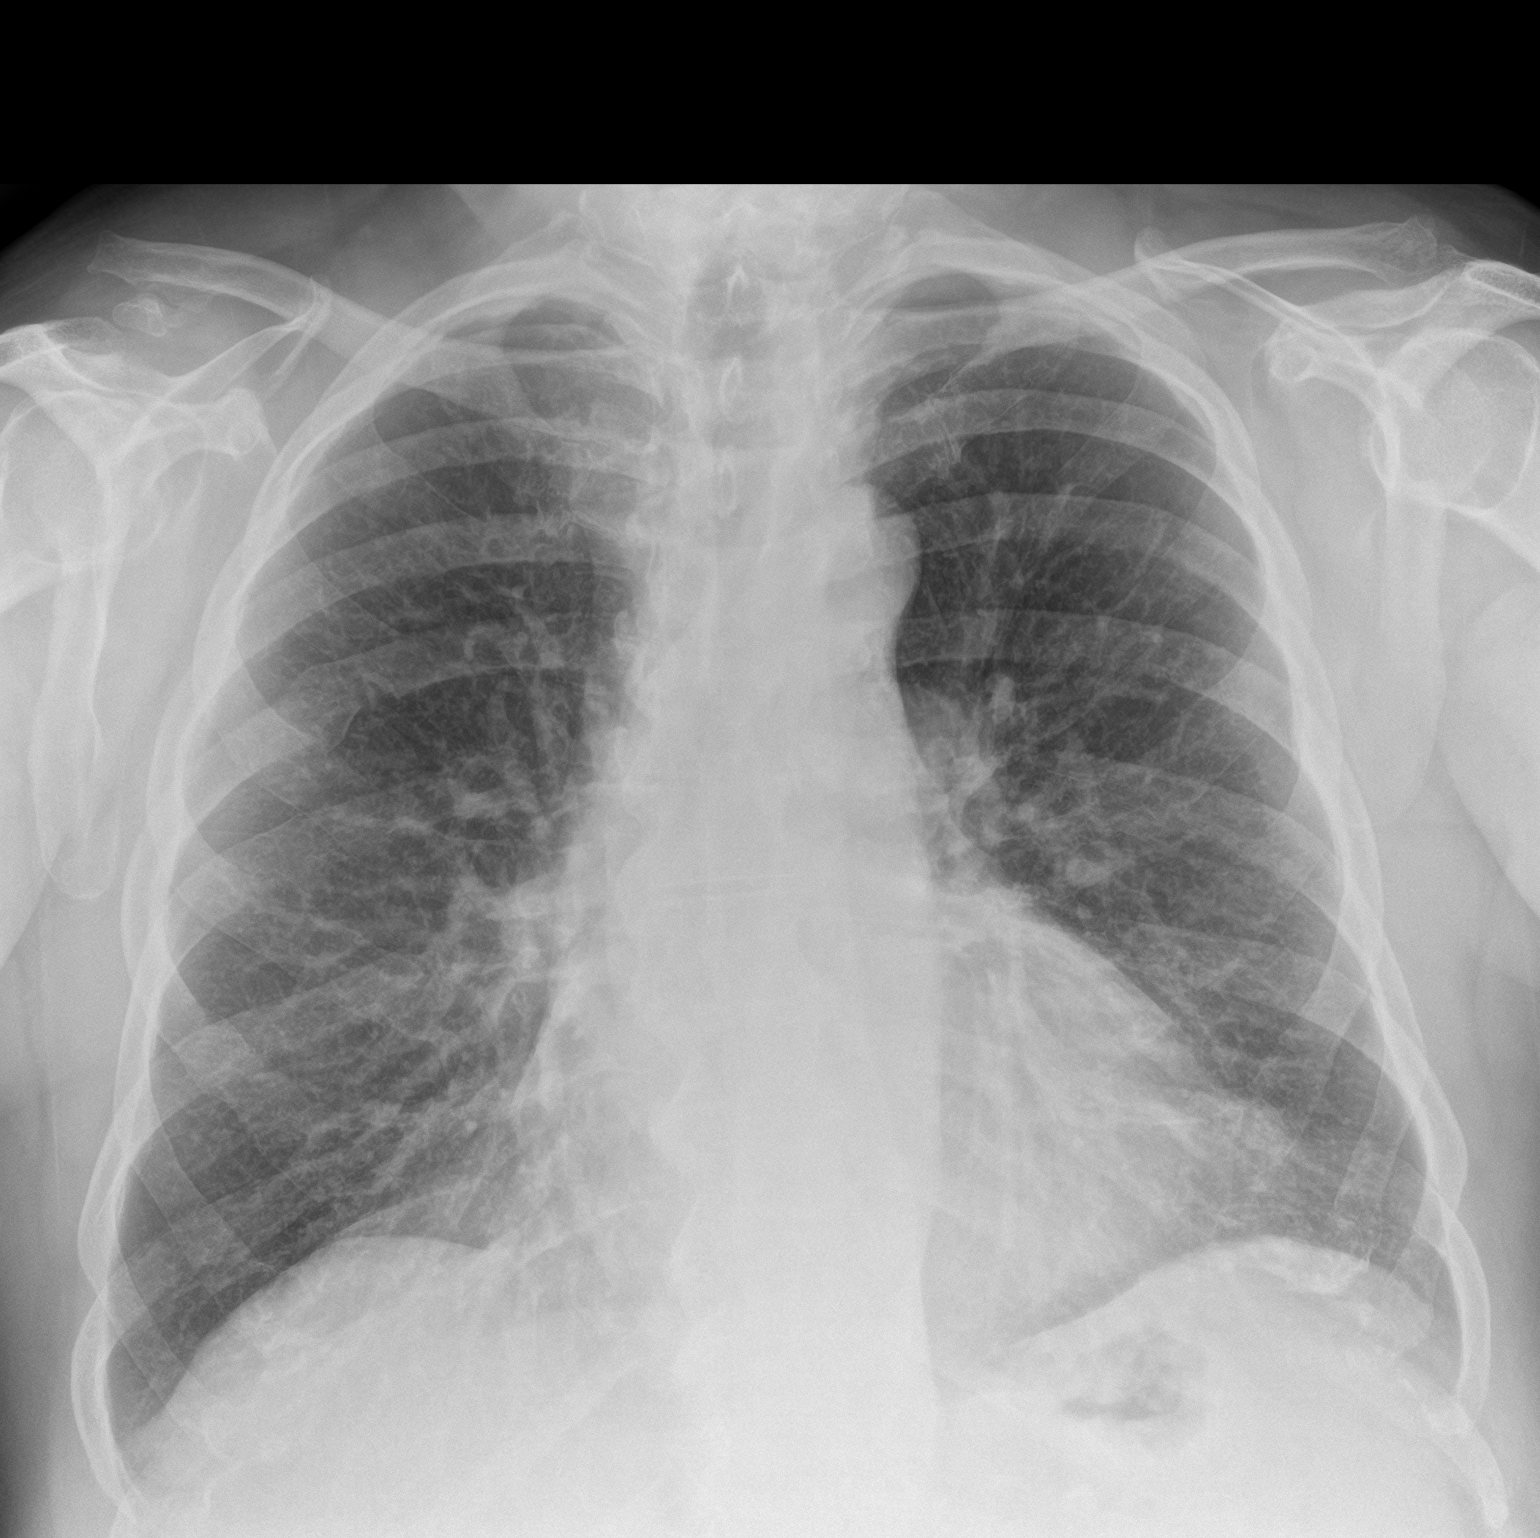

[chest lat]
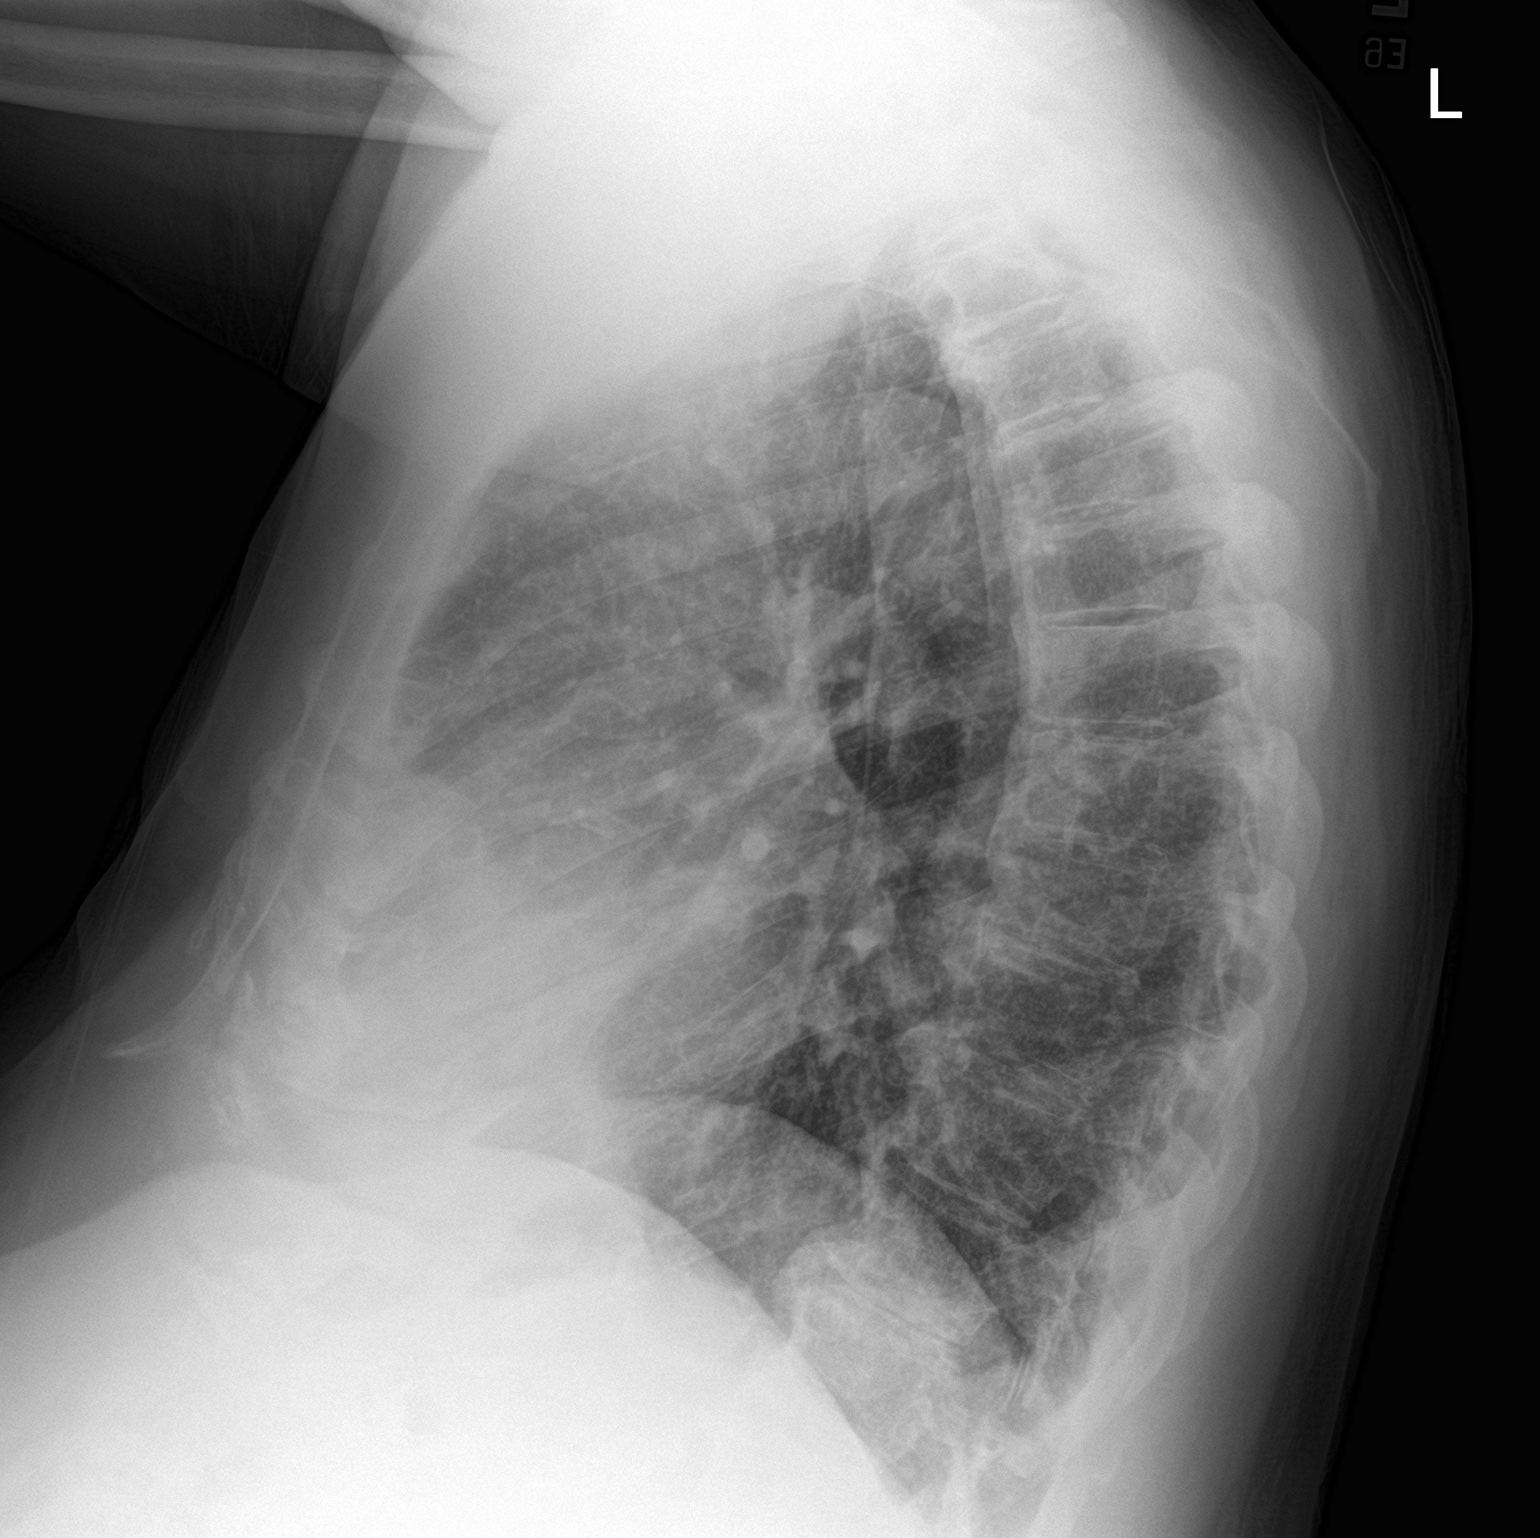

[2 of 2 positions shown; findings below may reference images not displayed]

FINDINGS: Mild peribronchial thickening and interstitial prominence, likely
bronchitic changes, stable since prior study. No acute confluent
opacities or effusions. Heart is upper limits normal in size.
Degenerative changes in the thoracic spine. No acute bony
abnormality. Chronic deformity at the right distal clavicle and AC
joint.
IMPRESSION: Stable chronic bronchitic changes.

No active disease.

## 2023-01-21 DIAGNOSIS — Z23 Encounter for immunization: Secondary | ICD-10-CM | POA: Diagnosis not present

## 2023-05-03 DIAGNOSIS — E782 Mixed hyperlipidemia: Secondary | ICD-10-CM | POA: Diagnosis not present

## 2023-05-03 DIAGNOSIS — E039 Hypothyroidism, unspecified: Secondary | ICD-10-CM | POA: Diagnosis not present

## 2023-05-31 DIAGNOSIS — I10 Essential (primary) hypertension: Secondary | ICD-10-CM | POA: Diagnosis not present

## 2023-05-31 DIAGNOSIS — E039 Hypothyroidism, unspecified: Secondary | ICD-10-CM | POA: Diagnosis not present

## 2023-05-31 DIAGNOSIS — E782 Mixed hyperlipidemia: Secondary | ICD-10-CM | POA: Diagnosis not present

## 2023-07-01 DIAGNOSIS — E063 Autoimmune thyroiditis: Secondary | ICD-10-CM | POA: Diagnosis not present

## 2023-07-01 DIAGNOSIS — E782 Mixed hyperlipidemia: Secondary | ICD-10-CM | POA: Diagnosis not present

## 2023-07-31 DIAGNOSIS — N401 Enlarged prostate with lower urinary tract symptoms: Secondary | ICD-10-CM | POA: Diagnosis not present

## 2023-07-31 DIAGNOSIS — E063 Autoimmune thyroiditis: Secondary | ICD-10-CM | POA: Diagnosis not present

## 2023-07-31 DIAGNOSIS — I10 Essential (primary) hypertension: Secondary | ICD-10-CM | POA: Diagnosis not present

## 2023-08-31 DIAGNOSIS — E782 Mixed hyperlipidemia: Secondary | ICD-10-CM | POA: Diagnosis not present

## 2023-08-31 DIAGNOSIS — E063 Autoimmune thyroiditis: Secondary | ICD-10-CM | POA: Diagnosis not present

## 2023-12-02 DIAGNOSIS — E039 Hypothyroidism, unspecified: Secondary | ICD-10-CM | POA: Insufficient documentation

## 2023-12-02 DIAGNOSIS — I10 Essential (primary) hypertension: Secondary | ICD-10-CM | POA: Insufficient documentation

## 2023-12-03 DIAGNOSIS — E039 Hypothyroidism, unspecified: Secondary | ICD-10-CM | POA: Diagnosis not present

## 2023-12-03 DIAGNOSIS — Z6829 Body mass index (BMI) 29.0-29.9, adult: Secondary | ICD-10-CM | POA: Diagnosis not present

## 2023-12-03 DIAGNOSIS — Z87891 Personal history of nicotine dependence: Secondary | ICD-10-CM | POA: Diagnosis not present

## 2023-12-03 DIAGNOSIS — Z79890 Hormone replacement therapy (postmenopausal): Secondary | ICD-10-CM | POA: Diagnosis not present

## 2023-12-03 DIAGNOSIS — F32A Depression, unspecified: Secondary | ICD-10-CM | POA: Diagnosis not present

## 2023-12-03 DIAGNOSIS — E782 Mixed hyperlipidemia: Secondary | ICD-10-CM | POA: Diagnosis not present

## 2023-12-03 DIAGNOSIS — Z23 Encounter for immunization: Secondary | ICD-10-CM | POA: Diagnosis not present

## 2023-12-03 DIAGNOSIS — Z79899 Other long term (current) drug therapy: Secondary | ICD-10-CM | POA: Diagnosis not present

## 2023-12-03 DIAGNOSIS — I10 Essential (primary) hypertension: Secondary | ICD-10-CM | POA: Diagnosis not present

## 2023-12-03 DIAGNOSIS — E663 Overweight: Secondary | ICD-10-CM | POA: Diagnosis not present

## 2023-12-03 DIAGNOSIS — Z7689 Persons encountering health services in other specified circumstances: Secondary | ICD-10-CM | POA: Diagnosis not present

## 2023-12-13 DIAGNOSIS — H60509 Unspecified acute noninfective otitis externa, unspecified ear: Secondary | ICD-10-CM | POA: Diagnosis not present

## 2024-01-28 DIAGNOSIS — D225 Melanocytic nevi of trunk: Secondary | ICD-10-CM | POA: Diagnosis not present

## 2024-01-28 DIAGNOSIS — D2261 Melanocytic nevi of right upper limb, including shoulder: Secondary | ICD-10-CM | POA: Diagnosis not present

## 2024-01-28 DIAGNOSIS — D485 Neoplasm of uncertain behavior of skin: Secondary | ICD-10-CM | POA: Diagnosis not present

## 2024-02-12 DIAGNOSIS — D485 Neoplasm of uncertain behavior of skin: Secondary | ICD-10-CM | POA: Diagnosis not present

## 2024-02-12 DIAGNOSIS — L820 Inflamed seborrheic keratosis: Secondary | ICD-10-CM | POA: Diagnosis not present

## 2024-02-12 DIAGNOSIS — L988 Other specified disorders of the skin and subcutaneous tissue: Secondary | ICD-10-CM | POA: Diagnosis not present

## 2024-02-12 DIAGNOSIS — L821 Other seborrheic keratosis: Secondary | ICD-10-CM | POA: Diagnosis not present

## 2024-02-20 DIAGNOSIS — E663 Overweight: Secondary | ICD-10-CM | POA: Diagnosis not present

## 2024-02-20 DIAGNOSIS — F32A Depression, unspecified: Secondary | ICD-10-CM | POA: Diagnosis not present

## 2024-02-20 DIAGNOSIS — E782 Mixed hyperlipidemia: Secondary | ICD-10-CM | POA: Diagnosis not present

## 2024-02-20 DIAGNOSIS — Z6829 Body mass index (BMI) 29.0-29.9, adult: Secondary | ICD-10-CM | POA: Diagnosis not present

## 2024-02-20 DIAGNOSIS — E785 Hyperlipidemia, unspecified: Secondary | ICD-10-CM | POA: Diagnosis not present

## 2024-02-20 DIAGNOSIS — M25561 Pain in right knee: Secondary | ICD-10-CM | POA: Diagnosis not present

## 2024-02-20 DIAGNOSIS — K429 Umbilical hernia without obstruction or gangrene: Secondary | ICD-10-CM | POA: Diagnosis not present

## 2024-02-20 DIAGNOSIS — I10 Essential (primary) hypertension: Secondary | ICD-10-CM | POA: Diagnosis not present

## 2024-02-20 DIAGNOSIS — Z7689 Persons encountering health services in other specified circumstances: Secondary | ICD-10-CM | POA: Diagnosis not present

## 2024-02-20 DIAGNOSIS — E039 Hypothyroidism, unspecified: Secondary | ICD-10-CM | POA: Diagnosis not present

## 2024-03-02 ENCOUNTER — Encounter: Payer: Self-pay | Admitting: *Deleted

## 2024-03-24 ENCOUNTER — Ambulatory Visit: Admitting: Urology

## 2024-03-30 NOTE — Progress Notes (Deleted)
 "   Impression/Assessment:  1.  History of LUTS/dysuria/pneumaturia, resolved.  Suspicious for a fistula but patient not currently symptomatic and has negative cystoscopy  2.  Mild elevation of PSA with normal exam  Plan:  1.  Patient currently reassured about exam.  I will let him know that I have reviewed his PSA sent by Dr. Rogenia  2.  I will have him come back in 74-months for recheck of PSA   History of Present Illness:   This man is here today for follow-up of probable colovesical fistula.  Initial visit 2.6.2024.  At that time he was having discolored urine, dysuria and pneumaturia.  CT abdomen and pelvis performed on 06/04/2022-   -Colovesical fistula between the anterior bladder dome and adjacent sigmoid colon -Severe colonic diverticulosis, without radiographic evidence of active diverticulitis.  -Cholelithiasis. No radiographic evidence of cholecystitis.  -1.0 cm indeterminate subcapsular lesion in posterior midpole of left   kidney, which could be due to solid neoplasm or hemorrhagic cyst.   Recommend abdomen MRI without and with contrast for further   characterization.  -Mildly enlarged prostate.  -Small left femoral hernia, which contains only fat.  -Aortic Atherosclerosis (ICD10-I70.0).   4.4.2024: Colonoscopy by Dr. Shaaron.  No definitive fistula seen.  Multiple diverticula, polyps biopsied.   4.9.2024: Here for recheck.  He has not had pneumaturia since his last visit.  He did have gas in his bladder on his CT scan a month ago.  Cystoscopy was unremarkable.  6.4.2024: He has not had any real voiding issues.  No gross hematuria, no more pneumaturia, no dysuria.  9.10.2024: Here for routine follow-up.  He did have a PSA drawn at his PCP last month that was 5.0.  I have no old PSA data.  It is first visit in February of this year prostate was felt to be normal in size without distinguishing features.  He has had no dysuria and denies significant LUTS.  IPSS  4/2.  12.30.2025:  Past Medical History:  Diagnosis Date   Hyperlipidemia    Hypertension     Past Surgical History:  Procedure Laterality Date   COLON SURGERY     Right hemicolectomy; Pathology with 1.5 cm sessile serrated adenoma, small hyperplastic polyp, 14 negative lymph nodes.   COLONOSCOPY W/ POLYPECTOMY     Sessile polyp on IC valve, protruding neoplasm at appendiceal orifice s/p biopsies, sessile polyp in transverse colon, extensive diverticular disease in the sigmoid colon, pedunculated polyp and sessile polyp in sigmoid colon.  Pathology from cecal/appendiceal biopsies with tubular adenoma, sessile serrated adenoma.  Also with sessile serrated adenoma in sigmoid and tubular adenoma in transverse   COLONOSCOPY WITH PROPOFOL  N/A 07/05/2022   Procedure: COLONOSCOPY WITH PROPOFOL ;  Surgeon: Shaaron Lamar HERO, MD;  Location: [facility];  Service: Endoscopy;  Laterality: N/A;  10:30 am   EYE SURGERY Bilateral 07/01/2012   cataract surgery   HERNIA REPAIR Right 06/13/2012   Right inguinal hernia repair   KNEE SURGERY  04/02/2005   POLYPECTOMY  07/05/2022   Procedure: POLYPECTOMY;  Surgeon: Shaaron Lamar HERO, MD;  Location: [facility];  Service: Endoscopy;;  hot and cold snare    Home Medications:  Allergies as of 03/31/2024   No Known Allergies      Medication List        Accurate as of March 30, 2024  1:08 PM. If you have any questions, ask your nurse or doctor.          diltiazem  120 MG 24 hr capsule Commonly known as: CARDIZEM CD Take 120 mg by mouth daily.   diltiazem 120 MG tablet Commonly known as: CARDIZEM Take 120 mg by mouth 2 (two) times daily.   fenofibrate 160 MG tablet Take 160 mg by mouth every evening.   Fish Oil 1000 MG Caps Take 1,000 mg by mouth daily.   Flaxseed Oil 1200 MG Caps Take 1,200 mg by mouth daily.   folic acid  1 MG tablet Commonly known as: FOLVITE  Take 1 mg by mouth daily.   hydrochlorothiazide 25 MG tablet Commonly  known as: HYDRODIURIL Take 25 mg by mouth 2 (two) times daily.   levothyroxine 50 MCG tablet Commonly known as: SYNTHROID Take 50 mcg by mouth daily before breakfast.   olmesartan 40 MG tablet Commonly known as: BENICAR Take 40 mg by mouth daily.   simvastatin 40 MG tablet Commonly known as: ZOCOR Take 40 mg by mouth every evening.        Allergies: No Known Allergies  Family History  Problem Relation Age of Onset   Cancer Mother        Breast   Hypertension Mother    Cancer Sister        Breast    Social History:  reports that he quit smoking about 11 years ago. His smoking use included cigarettes. He smoked an average of 1 pack per day. He has quit using smokeless tobacco. He reports current alcohol use. He reports that he does not use drugs.  ROS: A complete review of systems was performed.  All systems are negative except for pertinent findings as noted.  Physical Exam:  Vital signs in last 24 hours: There were no vitals taken for this visit. Constitutional:  Alert and oriented, No acute distress Cardiovascular: Regular rate  Respiratory: Normal respiratory effort Neurologic: Grossly intact, no focal deficits Psychiatric: Normal mood and affect  I have reviewed prior pt notes  Referring PCP notes reviewed as well as 36 pages of labs  I have reviewed urinalysis results-clear  I have independently reviewed prior imaging--CT A/P  I have reviewed prior PSA results-recently 5.0        "

## 2024-03-31 ENCOUNTER — Ambulatory Visit: Admitting: Urology

## 2024-03-31 DIAGNOSIS — R310 Gross hematuria: Secondary | ICD-10-CM

## 2024-03-31 DIAGNOSIS — R8281 Pyuria: Secondary | ICD-10-CM

## 2024-03-31 DIAGNOSIS — K5790 Diverticulosis of intestine, part unspecified, without perforation or abscess without bleeding: Secondary | ICD-10-CM

## 2024-03-31 DIAGNOSIS — R972 Elevated prostate specific antigen [PSA]: Secondary | ICD-10-CM

## 2024-04-01 ENCOUNTER — Telehealth: Payer: Self-pay

## 2024-04-01 NOTE — Telephone Encounter (Signed)
 Patient cancelled his 03/31/2024 appointment.  His primary doctor tested PSA levels.  Results were normal.

## 2024-04-01 NOTE — Telephone Encounter (Signed)
 No follow up appt is scheduled at this time.  Please see below and advise on how patient should follow up in the future.

## 2024-04-08 ENCOUNTER — Telehealth: Payer: Self-pay | Admitting: Orthopedic Surgery

## 2024-04-08 NOTE — Telephone Encounter (Signed)
 I tried calling patient with no answer.  I left a voicemail to call back for follow up details.  Patient can follow up PRN per MD.  Thanks

## 2024-04-08 NOTE — Telephone Encounter (Signed)
 Received a referral form Lauraine Lunger, NP for this patient to come and see you for his rt knee.  I spoke w/the pt, he stated that Dr. Jane did surgery at least 42yrs ago.  He hasn't had any ED or x-rays since then.  He did say that the referring office also reached out to [facility] office for him a while back and he hasn't heard anything.  He is requesting to see you, will you see, do you need notes?  [phone]

## 2024-04-16 ENCOUNTER — Other Ambulatory Visit: Payer: Self-pay | Admitting: *Deleted

## 2024-04-16 DIAGNOSIS — K429 Umbilical hernia without obstruction or gangrene: Secondary | ICD-10-CM

## 2024-04-21 ENCOUNTER — Ambulatory Visit: Admitting: Orthopedic Surgery

## 2024-04-21 ENCOUNTER — Other Ambulatory Visit (INDEPENDENT_AMBULATORY_CARE_PROVIDER_SITE_OTHER): Payer: Self-pay

## 2024-04-21 ENCOUNTER — Ambulatory Visit: Admitting: General Surgery

## 2024-04-21 ENCOUNTER — Encounter: Payer: Self-pay | Admitting: Orthopedic Surgery

## 2024-04-21 ENCOUNTER — Other Ambulatory Visit: Payer: Self-pay

## 2024-04-21 VITALS — BP 141/91 | HR 65 | Ht 71.0 in | Wt 230.0 lb

## 2024-04-21 DIAGNOSIS — R2689 Other abnormalities of gait and mobility: Secondary | ICD-10-CM

## 2024-04-21 DIAGNOSIS — M170 Bilateral primary osteoarthritis of knee: Secondary | ICD-10-CM | POA: Diagnosis not present

## 2024-04-21 DIAGNOSIS — G8929 Other chronic pain: Secondary | ICD-10-CM

## 2024-04-21 DIAGNOSIS — M25561 Pain in right knee: Secondary | ICD-10-CM

## 2024-04-21 DIAGNOSIS — M25562 Pain in left knee: Secondary | ICD-10-CM

## 2024-04-21 DIAGNOSIS — M1712 Unilateral primary osteoarthritis, left knee: Secondary | ICD-10-CM

## 2024-04-21 DIAGNOSIS — M1711 Unilateral primary osteoarthritis, right knee: Secondary | ICD-10-CM

## 2024-04-21 MED ORDER — MELOXICAM 7.5 MG PO TABS: 7.5000 mg | ORAL_TABLET | Freq: Every day | ORAL | 5 refills | Status: AC

## 2024-04-21 MED ORDER — METHYLPREDNISOLONE ACETATE 40 MG/ML IJ SUSP
40.0000 mg | Freq: Once | INTRAMUSCULAR | Status: AC
  Administered 2024-04-21: 40 mg via INTRA_ARTICULAR

## 2024-04-21 NOTE — Progress Notes (Unsigned)
" °  Intake history:  Chief Complaint  Patient presents with   Knee Pain    Both knees right more than left for a year      BP (!) 141/91   Pulse 65   Ht 5' 11 (1.803 m)   Wt 230 lb (104.3 kg)   BMI 32.08 kg/m  Body mass index is 32.08 kg/m.  Pharmacy? ____[facility] __________________________________  WHAT ARE WE SEEING YOU FOR TODAY?   Both knees   How long has this bothered you? (DOI?DOS?WS?)  approximately 1 years(s) ago  Was there an injury? No  Anticoag.  No   Any ALLERGIES _______________Allergies[1] _______________________________   Treatment:  Have you taken:  Tylenol  Yes  Advil Yes  Had PT No  Had injection No  Other  _________________________        [1] No Known Allergies  "

## 2024-04-21 NOTE — Patient Instructions (Addendum)
 Physical therapy has been ordered for you at [facility]. They should call you to schedule, [phone] is the phone number to call, if you want to call to schedule.   Joint Steroid Injection A joint steroid injection is a procedure to relieve swelling and pain in a joint. Steroids are medicines that reduce inflammation. In this procedure, your health care provider uses a syringe and a needle to inject a steroid medicine into a painful and inflamed joint. A pain-relieving medicine (anesthetic) may be injected along with the steroid. In some cases, your health care provider may use an imaging technique such as ultrasound or fluoroscopy to guide the injection. Joints that are often treated with steroid injections include the knee, shoulder, hip, and spine. These injections may also be used in the elbow, ankle, and joints of the hands or feet. You may have joint steroid injections as part of your treatment for inflammation caused by: Gout. Rheumatoid arthritis. Advanced wear-and-tear arthritis (osteoarthritis). Tendinitis. Bursitis. Joint steroid injections may be repeated, but having them too often can damage a joint or the skin over the joint. You should not have joint steroid injections less than 6 weeks apart or more than four times a year. Tell a health care provider about: Any allergies you have. All medicines you are taking, including vitamins, herbs, eye drops, creams, and over-the-counter medicines. Any problems you or family members have had with anesthetic medicines. Any blood disorders you have. Any surgeries you have had. Any medical conditions you have. Whether you are pregnant or may be pregnant. What are the risks? Generally, this is a safe treatment. However, problems may occur, including: Infection. Bleeding. Allergic reactions to medicines. Damage to the joint or tissues around the joint. Thinning of skin or loss of skin color over the joint. Temporary flushing of the face  or chest. Temporary increase in pain. Temporary increase in blood sugar. Failure to relieve inflammation or pain. What happens before the treatment? Medicines Ask your health care provider about: Changing or stopping your regular medicines. This is especially important if you are taking diabetes medicines or blood thinners. Taking medicines such as aspirin and ibuprofen. These medicines can thin your blood. Do not take these medicines unless your health care provider tells you to take them. Taking over-the-counter medicines, vitamins, herbs, and supplements. General instructions You may have imaging tests of your joint. Ask your health care provider if you can drive yourself home after the procedure. What happens during the treatment?  Your health care provider will position you for the injection and locate the injection site over your joint. The skin over the joint will be cleaned with a germ-killing soap. Your health care provider may: Spray a numbing solution (topical anesthetic) over the injection site. Inject a local anesthetic under the skin above your joint. The needle will be placed through your skin into your joint. Your health care provider may use imaging to guide the needle to the right spot for the injection. If imaging is used, a special contrast dye may be injected to confirm that the needle is in the correct location. The steroid medicine will be injected into your joint. Anesthetic may be injected along with the steroid. This may be a medicine that relieves pain for a short time (short-acting anesthetic) or for a longer time (long-acting anesthetic). The needle will be removed, and an adhesive bandage (dressing) will be placed over the injection site. The procedure may vary among health care providers and hospitals. What can  I expect after the treatment? You will be able to go home after the treatment. It is normal to feel slight flushing for a few days after the  injection. After the treatment, it is common to have an increase in joint pain after the anesthetic has worn off. This may happen about an hour after a short-acting anesthetic or about 8 hours after a longer-acting anesthetic. You should begin to feel relief from joint pain and swelling after 24 to 48 hours. Contact your health care provider if you do not begin to feel relief after 2 days. Follow these instructions at home: Injection site care Leave the adhesive dressing over your injection site in place until your health care provider says you can remove it. Check your injection site every day for signs of infection. Check for: More redness, swelling, or pain. Fluid or blood. Warmth. Pus or a bad smell. Activity Return to your normal activities as told by your health care provider. Ask your health care provider what activities are safe for you. You may be asked to limit activities that put stress on the joint for a few days. Do joint exercises as told by your health care provider. Do not take baths, swim, or use a hot tub until your health care provider approves. Ask your health care provider if you may take showers. You may only be allowed to take sponge baths. Managing pain, stiffness, and swelling  If directed, put ice on the joint. To do this: Put ice in a plastic bag. Place a towel between your skin and the bag. Leave the ice on for 20 minutes, 2-3 times a day. Remove the ice if your skin turns bright red. This is very important. If you cannot feel pain, heat, or cold, you have a greater risk of damage to the area. Raise (elevate) your joint above the level of your heart when you are sitting or lying down. General instructions Take over-the-counter and prescription medicines only as told by your health care provider. Do not use any products that contain nicotine or tobacco, such as cigarettes, e-cigarettes, and chewing tobacco. These can delay joint healing. If you need help quitting,  ask your health care provider. If you have diabetes, be aware that your blood sugar may be slightly elevated for several days after the injection. Keep all follow-up visits. This is important. Contact a health care provider if you have: Chills or a fever. Any signs of infection at your injection site. Increased pain or swelling or no relief after 2 days. Summary A joint steroid injection is a treatment to relieve pain and swelling in a joint. Steroids are medicines that reduce inflammation. Your health care provider may add an anesthetic along with the steroid. You may have joint steroid injections as part of your arthritis treatment. Joint steroid injections may be repeated, but having them too often can damage a joint or the skin over the joint. Contact your health care provider if you have a fever, chills, or signs of infection, or if you get no relief from joint pain or swelling. This information is not intended to replace advice given to you by your health care provider. Make sure you discuss any questions you have with your health care provider. Document Revised: 08/28/2019 Document Reviewed: 08/28/2019 Elsevier Patient Education  2024 [employer].

## 2024-04-21 NOTE — Progress Notes (Unsigned)
 "  Patient ID: Erik Hernandez, male   DOB: December 24, 1950, 74 y.o.   MRN: [id number]  Chief Complaint  Patient presents with   Knee Pain    Both knees right more than left for a year    Assessment and plan  74 year old male with bilateral knee pain right greater than left and new onset issues with balance.  He appears to have osteoarthritis in both knees grade 2 on the left grade 3 on the right.  Going to start him on meloxicam  and give him 2 knee injections and see him back in 2 months  We ordered physical therapy to address his balance issue with the differential diagnosis of: Inner ear difficulty and dysfunction, spinal stenosis, neurologic disorder.  Return in 2 months   Current Outpatient Medications  Medication Instructions   diltiazem (CARDIZEM CD) 120 mg, Oral, Daily   diltiazem (CARDIZEM) 120 mg, Oral, 2 times daily   fenofibrate 160 mg, Oral, Every evening   Fish Oil 1,000 mg, Oral, Daily   Flaxseed Oil 1,200 mg, Oral, Daily   folic acid  (FOLVITE ) 1 mg, Oral, Daily   hydrochlorothiazide (HYDRODIURIL) 25 mg, Oral, 2 times daily   levothyroxine (SYNTHROID) 50 mcg, Oral, Daily before breakfast   meloxicam  (MOBIC ) 7.5 mg, Oral, Daily   olmesartan (BENICAR) 40 mg, Oral, Daily   simvastatin (ZOCOR) 40 mg, Oral, Every evening    Allergies[1]   This is a 74 year old male who presents with 1 year history of progressively worsening bilateral knee pain right worse than left.  He is status post arthroscopy right knee by Dr. Jane 15 years ago.  He did well until a year ago.     He has noticed progressively worsening medial knee pain and difficulty ambulating.  He also complains of some issues with balance.  He says the knee feels like it may give on him although it has not    Knee Pain  Pertinent negatives include no tingling.    Review of Systems  Respiratory:  Negative for shortness of breath.   Cardiovascular:  Negative for chest pain.  Musculoskeletal:  Positive  for myalgias.  Neurological:  Negative for dizziness, tingling, tremors, sensory change, speech change, focal weakness, seizures, loss of consciousness, weakness and headaches.       C/o balance issues   All other systems reviewed and are negative.   Past Medical History:  Diagnosis Date   Hyperlipidemia    Hypertension     Past Surgical History:  Procedure Laterality Date   COLON SURGERY     Right hemicolectomy; Pathology with 1.5 cm sessile serrated adenoma, small hyperplastic polyp, 14 negative lymph nodes.   COLONOSCOPY W/ POLYPECTOMY     Sessile polyp on IC valve, protruding neoplasm at appendiceal orifice s/p biopsies, sessile polyp in transverse colon, extensive diverticular disease in the sigmoid colon, pedunculated polyp and sessile polyp in sigmoid colon.  Pathology from cecal/appendiceal biopsies with tubular adenoma, sessile serrated adenoma.  Also with sessile serrated adenoma in sigmoid and tubular adenoma in transverse   COLONOSCOPY WITH PROPOFOL  N/A 07/05/2022   Procedure: COLONOSCOPY WITH PROPOFOL ;  Surgeon: Shaaron Lamar HERO, MD;  Location: [facility];  Service: Endoscopy;  Laterality: N/A;  10:30 am   EYE SURGERY Bilateral 07/01/2012   cataract surgery   HERNIA REPAIR Right 06/13/2012   Right inguinal hernia repair   KNEE SURGERY  04/02/2005   POLYPECTOMY  07/05/2022   Procedure: POLYPECTOMY;  Surgeon: Shaaron Lamar HERO, MD;  Location: [facility];  Service: Endoscopy;;  hot and cold snare    PHYSICAL EXAM  BP (!) 141/91   Pulse 65   Ht 5' 11 (1.803 m)   Wt 230 lb (104.3 kg)   BMI 32.08 kg/m  GENERAL appearance reveals no gross abnormalities, normal development grooming and hygiene   MENTAL STATUS we note that the patient is awake alert and oriented to person place and time MOOD/AFFECT ARE NORMAL   GAIT reveals mild limp favoring his right leg   Right Knee Exam   Muscle Strength  The patient has normal right knee strength.  Tenderness  The patient is  experiencing tenderness in the medial joint line.  Range of Motion  Extension:  5  Right knee flexion: 125.   Tests  Varus: negative Valgus: negative Drawer:  Anterior - negative    Posterior - negative  Other  Swelling: mild Effusion: effusion present  Comments:  Hip ROM normal    Left Knee Exam   Muscle Strength  The patient has normal left knee strength.  Tenderness  The patient is experiencing tenderness in the medial joint line.  Range of Motion  Extension:  5  Left knee flexion: 125.   Tests  Varus: negative Valgus: negative Drawer:  Anterior - negative     Posterior - negative  Other  Swelling: none Effusion: effusion present  Comments:  Hip ROM normal      VASC both lower extremities exhibit no edema and no skin changes  NEURO normal sensation and no pathologic reflexes  LYMPH deferred noncontributory  MEDICAL DECISION MAKING  A.  Encounter Diagnoses  Name Primary?   Bilateral chronic knee pain    Primary osteoarthritis of right knee Yes   Primary osteoarthritis of left knee    Balance disorder     B. DATA ANALYSED:  LAB TESTS: NO   IMAGING: Independent interpretation of imaging performed by another physician or other qualified health professional not separately reported: No  My interpretation of the images are: DG Knee AP/LAT W/Sunrise Right Result Date: 04/21/2024 AP and lateral sunrise of both knees Left knee the left knee alignment is still in valgus. Joint space narrowing is noted in the medial side.  Secondary bone changes no osteophytes subchondral sclerosis or cyst formation is noted. Patellofemoral alignment is normal there is a lateral traction osteophyte Impression left knee mild OA grade 1/2 Right knee Alignment the knee is still in valgus but it is trending towards varus.  There is joint space narrowing medially, with femoral and tibial osteophytes.  We see peaking of the tibial spine lateral osteophytes patellofemoral  osteophytes. There are posterior osteophytes in the femur with calcification of the popliteal vessels. There is also an entheseophyte in the quadriceps tendon Grade 3 arthritis   DG Knee AP/LAT W/Sunrise Left Result Date: 04/21/2024 AP and lateral sunrise of both knees Left knee the left knee alignment is still in valgus. Joint space narrowing is noted in the medial side.  Secondary bone changes no osteophytes subchondral sclerosis or cyst formation is noted. Patellofemoral alignment is normal there is a lateral traction osteophyte Impression left knee mild OA grade 1/2 Right knee Alignment the knee is still in valgus but it is trending towards varus.  There is joint space narrowing medially, with femoral and tibial osteophytes.  We see peaking of the tibial spine lateral osteophytes patellofemoral osteophytes. There are posterior osteophytes in the femur with calcification of the popliteal vessels. There is also an entheseophyte in the quadriceps  tendon Grade 3 arthritis        Outside records reviewed: none  C. MANAGEMENT RISK: (MLD MOD SEVERE) - moderate    Meds ordered this encounter  Medications   meloxicam  (MOBIC ) 7.5 MG tablet    Sig: Take 1 tablet (7.5 mg total) by mouth daily.    Dispense:  30 tablet    Refill:  5   methylPREDNISolone  acetate (DEPO-MEDROL ) injection 40 mg   methylPREDNISolone  acetate (DEPO-MEDROL ) injection 40 mg     Procedure note for injections   Encounter Diagnoses  Name Primary?   Bilateral chronic knee pain    Primary osteoarthritis of right knee Yes   Primary osteoarthritis of left knee    Balance disorder         The patient has consented for injection of the bilateral  Joint: knee  Medication: Depo-Medrol  40 mg and lidocaine  1%  Time out completed: Yes  The left knee and then the right knee :  was cleaned with alcohol and ethyl chloride.  The injection was given without any complications appropriate precautions were given.       [1] No  Known Allergies  "

## 2024-06-19 ENCOUNTER — Ambulatory Visit: Admitting: Orthopedic Surgery
